# Patient Record
Sex: Female | Born: 1945 | Race: Black or African American | Hispanic: No | Marital: Single | Smoking: Former smoker
Health system: Southern US, Community
[De-identification: ages and names within clinical notes are randomized; demographics above are authoritative.]

## PROBLEM LIST (undated history)

## (undated) DIAGNOSIS — M1990 Unspecified osteoarthritis, unspecified site: Secondary | ICD-10-CM

## (undated) DIAGNOSIS — I10 Essential (primary) hypertension: Secondary | ICD-10-CM

## (undated) DIAGNOSIS — E739 Lactose intolerance, unspecified: Secondary | ICD-10-CM

## (undated) DIAGNOSIS — C801 Malignant (primary) neoplasm, unspecified: Secondary | ICD-10-CM

## (undated) DIAGNOSIS — B019 Varicella without complication: Secondary | ICD-10-CM

## (undated) DIAGNOSIS — E785 Hyperlipidemia, unspecified: Secondary | ICD-10-CM

## (undated) DIAGNOSIS — R739 Hyperglycemia, unspecified: Secondary | ICD-10-CM

## (undated) DIAGNOSIS — E669 Obesity, unspecified: Secondary | ICD-10-CM

## (undated) DIAGNOSIS — T7840XA Allergy, unspecified, initial encounter: Secondary | ICD-10-CM

## (undated) HISTORY — DX: Malignant (primary) neoplasm, unspecified: C80.1

## (undated) HISTORY — DX: Unspecified osteoarthritis, unspecified site: M19.90

## (undated) HISTORY — DX: Hyperglycemia, unspecified: R73.9

## (undated) HISTORY — DX: Hyperlipidemia, unspecified: E78.5

## (undated) HISTORY — PX: HAND SURGERY: SHX662

## (undated) HISTORY — DX: Obesity, unspecified: E66.9

## (undated) HISTORY — PX: KNEE SURGERY: SHX244

## (undated) HISTORY — DX: Allergy, unspecified, initial encounter: T78.40XA

## (undated) HISTORY — PX: MASTECTOMY: SHX3

## (undated) HISTORY — DX: Lactose intolerance, unspecified: E73.9

## (undated) HISTORY — DX: Varicella without complication: B01.9

## (undated) HISTORY — DX: Essential (primary) hypertension: I10

## (undated) HISTORY — PX: CYST EXCISION: SHX5701

---

## 2004-05-14 HISTORY — PX: COLONOSCOPY: SHX174

## 2005-02-01 ENCOUNTER — Encounter: Admission: RE | Admit: 2005-02-01 | Discharge: 2005-02-01 | Payer: Self-pay | Admitting: Obstetrics and Gynecology

## 2005-02-20 ENCOUNTER — Ambulatory Visit: Payer: Self-pay | Admitting: Family Medicine

## 2006-02-08 ENCOUNTER — Ambulatory Visit: Payer: Self-pay | Admitting: Family Medicine

## 2006-02-20 ENCOUNTER — Encounter: Admission: RE | Admit: 2006-02-20 | Discharge: 2006-02-20 | Payer: Self-pay | Admitting: Family Medicine

## 2006-02-26 ENCOUNTER — Ambulatory Visit: Payer: Self-pay | Admitting: Cardiology

## 2006-03-14 ENCOUNTER — Encounter: Payer: Self-pay | Admitting: Cardiology

## 2006-03-14 ENCOUNTER — Ambulatory Visit: Payer: Self-pay

## 2006-03-22 ENCOUNTER — Ambulatory Visit: Payer: Self-pay | Admitting: Family Medicine

## 2006-03-28 ENCOUNTER — Ambulatory Visit: Payer: Self-pay | Admitting: Cardiology

## 2006-05-19 ENCOUNTER — Encounter: Payer: Self-pay | Admitting: Family Medicine

## 2006-05-29 ENCOUNTER — Ambulatory Visit: Payer: Self-pay | Admitting: Family Medicine

## 2006-05-29 ENCOUNTER — Encounter: Payer: Self-pay | Admitting: Family Medicine

## 2006-05-29 ENCOUNTER — Other Ambulatory Visit: Admission: RE | Admit: 2006-05-29 | Discharge: 2006-05-29 | Payer: Self-pay | Admitting: Family Medicine

## 2006-05-29 LAB — CONVERTED CEMR LAB
Albumin: 4.2 g/dL (ref 3.5–5.2)
Alkaline Phosphatase: 50 units/L (ref 39–117)
BUN: 21 mg/dL (ref 6–23)
Basophils Relative: 1.9 % — ABNORMAL HIGH (ref 0.0–1.0)
CO2: 30 meq/L (ref 19–32)
Cholesterol: 173 mg/dL (ref 0–200)
HDL: 61.8 mg/dL (ref >39.0)
Hemoglobin: 11.4 g/dL — ABNORMAL LOW (ref 12.0–15.0)
Lymphocytes Relative: 47.9 % — ABNORMAL HIGH (ref 12.0–46.0)
MCHC: 32.8 g/dL (ref 30.0–36.0)
Monocytes Absolute: 0.2 10*3/uL (ref 0.2–0.7)
Monocytes Relative: 4.9 % (ref 3.0–11.0)
Neutro Abs: 1.7 10*3/uL (ref 1.4–7.7)
Phosphorus: 2.6 mg/dL (ref 2.3–4.6)
Potassium: 3.7 meq/L (ref 3.5–5.1)
TSH: 0.38 microintl units/mL (ref 0.35–5.50)
Total Bilirubin: 0.8 mg/dL (ref 0.3–1.2)

## 2006-06-13 ENCOUNTER — Ambulatory Visit: Payer: Self-pay | Admitting: Family Medicine

## 2006-06-13 LAB — CONVERTED CEMR LAB
Basophils Relative: 1.4 % — ABNORMAL HIGH (ref 0.0–1.0)
Eosinophils Relative: 2.5 % (ref 0.0–5.0)
Folate: 8 ng/mL
Iron: 75 ug/dL (ref 42–145)
Lymphocytes Relative: 44.3 % (ref 12.0–46.0)
Neutro Abs: 2 10*3/uL (ref 1.4–7.7)
Platelets: 308 10*3/uL (ref 150–400)
RBC: 3.59 M/uL — ABNORMAL LOW (ref 3.87–5.11)
RDW: 12.7 % (ref 11.5–14.6)
Saturation Ratios: 23.3 % (ref 20.0–50.0)
Total CK: 334 units/L (ref 7–177)
Transferrin: 230.3 mg/dL (ref >=212.0)
WBC: 4.5 10*3/uL (ref 4.5–10.5)

## 2006-11-26 ENCOUNTER — Encounter: Payer: Self-pay | Admitting: Family Medicine

## 2006-11-26 DIAGNOSIS — I10 Essential (primary) hypertension: Secondary | ICD-10-CM

## 2006-11-26 DIAGNOSIS — J309 Allergic rhinitis, unspecified: Secondary | ICD-10-CM | POA: Insufficient documentation

## 2006-11-26 DIAGNOSIS — Z853 Personal history of malignant neoplasm of breast: Secondary | ICD-10-CM | POA: Insufficient documentation

## 2006-11-26 DIAGNOSIS — H409 Unspecified glaucoma: Secondary | ICD-10-CM | POA: Insufficient documentation

## 2006-11-26 DIAGNOSIS — I428 Other cardiomyopathies: Secondary | ICD-10-CM

## 2006-11-27 ENCOUNTER — Ambulatory Visit: Payer: Self-pay | Admitting: Family Medicine

## 2006-11-29 ENCOUNTER — Encounter: Payer: Self-pay | Admitting: Family Medicine

## 2007-03-05 ENCOUNTER — Encounter: Admission: RE | Admit: 2007-03-05 | Discharge: 2007-03-05 | Payer: Self-pay | Admitting: Family Medicine

## 2007-03-10 ENCOUNTER — Encounter (INDEPENDENT_AMBULATORY_CARE_PROVIDER_SITE_OTHER): Payer: Self-pay | Admitting: *Deleted

## 2007-07-30 ENCOUNTER — Ambulatory Visit: Payer: Self-pay | Admitting: Family Medicine

## 2007-09-03 ENCOUNTER — Encounter: Payer: Self-pay | Admitting: Family Medicine

## 2007-09-03 ENCOUNTER — Ambulatory Visit: Payer: Self-pay | Admitting: Family Medicine

## 2007-09-03 ENCOUNTER — Other Ambulatory Visit: Admission: RE | Admit: 2007-09-03 | Discharge: 2007-09-03 | Payer: Self-pay | Admitting: Family Medicine

## 2007-09-03 DIAGNOSIS — E785 Hyperlipidemia, unspecified: Secondary | ICD-10-CM

## 2007-09-03 DIAGNOSIS — Z87891 Personal history of nicotine dependence: Secondary | ICD-10-CM

## 2007-09-04 LAB — CONVERTED CEMR LAB
ALT: 22 units/L (ref 0–35)
AST: 27 units/L (ref 0–37)
Basophils Relative: 0.3 % (ref 0.0–1.0)
Bilirubin, Direct: 0.1 mg/dL (ref 0.0–0.3)
CO2: 28 meq/L (ref 19–32)
Calcium: 9.7 mg/dL (ref 8.4–10.5)
Chloride: 104 meq/L (ref 96–112)
Creatinine, Ser: 1 mg/dL (ref 0.4–1.2)
Glucose, Bld: 93 mg/dL (ref 70–99)
Hemoglobin: 11.6 g/dL — ABNORMAL LOW (ref 12.0–15.0)
LDL Cholesterol: 94 mg/dL (ref 0–99)
Lymphocytes Relative: 37.7 % (ref 12.0–46.0)
Monocytes Relative: 4.8 % (ref 3.0–12.0)
Neutro Abs: 2.1 10*3/uL (ref 1.4–7.7)
Neutrophils Relative %: 55 % (ref 43.0–77.0)
RBC: 3.42 M/uL — ABNORMAL LOW (ref 3.87–5.11)
Sodium: 141 meq/L (ref 135–145)
Total Bilirubin: 0.6 mg/dL (ref 0.3–1.2)
Total CHOL/HDL Ratio: 2.4
Total Protein: 7.2 g/dL (ref 6.0–8.3)
VLDL: 12 mg/dL (ref 0–40)

## 2007-09-09 ENCOUNTER — Encounter (INDEPENDENT_AMBULATORY_CARE_PROVIDER_SITE_OTHER): Payer: Self-pay | Admitting: *Deleted

## 2007-09-09 LAB — CONVERTED CEMR LAB: Pap Smear: NORMAL

## 2007-09-22 ENCOUNTER — Ambulatory Visit: Payer: Self-pay | Admitting: Family Medicine

## 2007-09-22 LAB — CONVERTED CEMR LAB: OCCULT 3: NEGATIVE

## 2007-09-23 ENCOUNTER — Encounter: Admission: RE | Admit: 2007-09-23 | Discharge: 2007-09-23 | Payer: Self-pay | Admitting: Family Medicine

## 2007-09-23 ENCOUNTER — Encounter: Payer: Self-pay | Admitting: Family Medicine

## 2007-09-24 ENCOUNTER — Encounter (INDEPENDENT_AMBULATORY_CARE_PROVIDER_SITE_OTHER): Payer: Self-pay | Admitting: *Deleted

## 2007-09-25 ENCOUNTER — Encounter (INDEPENDENT_AMBULATORY_CARE_PROVIDER_SITE_OTHER): Payer: Self-pay | Admitting: *Deleted

## 2007-10-07 ENCOUNTER — Ambulatory Visit: Payer: Self-pay | Admitting: Family Medicine

## 2007-10-07 DIAGNOSIS — Z862 Personal history of diseases of the blood and blood-forming organs and certain disorders involving the immune mechanism: Secondary | ICD-10-CM | POA: Insufficient documentation

## 2007-10-10 LAB — CONVERTED CEMR LAB
Basophils Relative: 1.3 % — ABNORMAL HIGH (ref 0.0–1.0)
Eosinophils Absolute: 0.1 10*3/uL (ref 0.0–0.7)
Eosinophils Relative: 2.7 % (ref 0.0–5.0)
Folate: 10.7 ng/mL
HCT: 34.2 % — ABNORMAL LOW (ref 36.0–46.0)
Hemoglobin: 11.2 g/dL — ABNORMAL LOW (ref 12.0–15.0)
MCV: 102.5 fL — ABNORMAL HIGH (ref 78.0–100.0)
Monocytes Absolute: 0.2 10*3/uL (ref 0.1–1.0)
Monocytes Relative: 4.9 % (ref 3.0–12.0)
Neutro Abs: 2.1 10*3/uL (ref 1.4–7.7)
Platelets: 264 10*3/uL (ref 150–400)
WBC: 3.9 10*3/uL — ABNORMAL LOW (ref 4.5–10.5)

## 2007-12-03 ENCOUNTER — Ambulatory Visit: Payer: Self-pay | Admitting: Family Medicine

## 2007-12-04 LAB — CONVERTED CEMR LAB
Eosinophils Absolute: 0.3 10*3/uL (ref 0.0–0.7)
Ferritin: 197.7 ng/mL (ref 10.0–291.0)
HCT: 34 % — ABNORMAL LOW (ref 36.0–46.0)
Hemoglobin: 12 g/dL (ref 12.0–15.0)
Iron: 70 ug/dL (ref 42–145)
MCHC: 35.4 g/dL (ref 30.0–36.0)
MCV: 99.3 fL (ref 78.0–100.0)
Monocytes Absolute: 0.4 10*3/uL (ref 0.1–1.0)
Monocytes Relative: 8.8 % (ref 3.0–12.0)
Neutro Abs: 2.2 10*3/uL (ref 1.4–7.7)
Platelets: 295 10*3/uL (ref 150–400)
RDW: 12.8 % (ref 11.5–14.6)

## 2008-01-14 ENCOUNTER — Ambulatory Visit: Payer: Self-pay | Admitting: Family Medicine

## 2008-03-25 ENCOUNTER — Encounter: Admission: RE | Admit: 2008-03-25 | Discharge: 2008-03-25 | Payer: Self-pay | Admitting: Family Medicine

## 2008-03-30 ENCOUNTER — Encounter: Payer: Self-pay | Admitting: Family Medicine

## 2008-05-18 ENCOUNTER — Ambulatory Visit: Payer: Self-pay | Admitting: Family Medicine

## 2008-05-18 DIAGNOSIS — E669 Obesity, unspecified: Secondary | ICD-10-CM

## 2008-05-25 ENCOUNTER — Ambulatory Visit: Payer: Self-pay | Admitting: Family Medicine

## 2008-08-02 ENCOUNTER — Telehealth: Payer: Self-pay | Admitting: Family Medicine

## 2008-08-25 ENCOUNTER — Ambulatory Visit: Payer: Self-pay | Admitting: Family Medicine

## 2008-08-26 LAB — CONVERTED CEMR LAB
ALT: 28 units/L (ref 0–35)
AST: 37 units/L (ref 0–37)
Basophils Absolute: 0 10*3/uL (ref 0.0–0.1)
Basophils Relative: 1 % (ref 0.0–3.0)
CO2: 29 meq/L (ref 19–32)
Chloride: 104 meq/L (ref 96–112)
Eosinophils Absolute: 0.2 10*3/uL (ref 0.0–0.7)
HDL: 70.5 mg/dL (ref >39.00)
Hemoglobin: 12.2 g/dL (ref 12.0–15.0)
Lymphocytes Relative: 45.3 % (ref 12.0–46.0)
MCHC: 33.4 g/dL (ref 30.0–36.0)
Monocytes Relative: 8.9 % (ref 3.0–12.0)
Neutrophils Relative %: 39.7 % — ABNORMAL LOW (ref 43.0–77.0)
Phosphorus: 3.6 mg/dL (ref 2.3–4.6)
Potassium: 3.7 meq/L (ref 3.5–5.1)
RBC: 3.56 M/uL — ABNORMAL LOW (ref 3.87–5.11)
RDW: 13 % (ref 11.5–14.6)
TSH: 0.44 microintl units/mL (ref 0.35–5.50)
Total CHOL/HDL Ratio: 3

## 2008-08-30 ENCOUNTER — Ambulatory Visit: Payer: Self-pay | Admitting: Family Medicine

## 2008-08-30 DIAGNOSIS — R7309 Other abnormal glucose: Secondary | ICD-10-CM | POA: Insufficient documentation

## 2008-12-15 ENCOUNTER — Ambulatory Visit: Payer: Self-pay | Admitting: Family Medicine

## 2009-01-03 ENCOUNTER — Encounter: Admission: RE | Admit: 2009-01-03 | Discharge: 2009-01-03 | Payer: Self-pay | Admitting: Family Medicine

## 2009-01-05 ENCOUNTER — Encounter: Payer: Self-pay | Admitting: Family Medicine

## 2009-01-27 ENCOUNTER — Encounter: Payer: Self-pay | Admitting: Family Medicine

## 2009-02-03 ENCOUNTER — Ambulatory Visit: Payer: Self-pay | Admitting: Family Medicine

## 2009-02-21 ENCOUNTER — Encounter: Payer: Self-pay | Admitting: Family Medicine

## 2009-02-24 ENCOUNTER — Encounter: Admission: RE | Admit: 2009-02-24 | Discharge: 2009-02-24 | Payer: Self-pay | Admitting: Orthopedic Surgery

## 2009-03-03 ENCOUNTER — Encounter: Payer: Self-pay | Admitting: Family Medicine

## 2009-03-18 ENCOUNTER — Ambulatory Visit (HOSPITAL_BASED_OUTPATIENT_CLINIC_OR_DEPARTMENT_OTHER): Admission: RE | Admit: 2009-03-18 | Discharge: 2009-03-18 | Payer: Self-pay | Admitting: Orthopedic Surgery

## 2009-03-24 ENCOUNTER — Encounter: Payer: Self-pay | Admitting: Family Medicine

## 2009-03-28 ENCOUNTER — Encounter: Admission: RE | Admit: 2009-03-28 | Discharge: 2009-03-28 | Payer: Self-pay | Admitting: Family Medicine

## 2009-03-30 ENCOUNTER — Encounter (INDEPENDENT_AMBULATORY_CARE_PROVIDER_SITE_OTHER): Payer: Self-pay | Admitting: *Deleted

## 2009-03-31 ENCOUNTER — Encounter: Payer: Self-pay | Admitting: Family Medicine

## 2009-04-11 ENCOUNTER — Encounter: Payer: Self-pay | Admitting: Family Medicine

## 2009-04-12 ENCOUNTER — Ambulatory Visit: Payer: Self-pay | Admitting: Family Medicine

## 2009-05-16 ENCOUNTER — Encounter: Payer: Self-pay | Admitting: Family Medicine

## 2009-06-13 ENCOUNTER — Encounter: Payer: Self-pay | Admitting: Family Medicine

## 2009-06-16 ENCOUNTER — Ambulatory Visit: Payer: Self-pay | Admitting: Family Medicine

## 2009-06-16 LAB — CONVERTED CEMR LAB
CO2: 30 meq/L (ref 19–32)
Calcium: 9.3 mg/dL (ref 8.4–10.5)
Chloride: 105 meq/L (ref 96–112)
Cholesterol: 163 mg/dL (ref 0–200)
HDL: 66.1 mg/dL (ref >39.00)
Potassium: 3.9 meq/L (ref 3.5–5.1)
Sodium: 141 meq/L (ref 135–145)
Triglycerides: 96 mg/dL (ref 0.0–149.0)

## 2009-06-22 ENCOUNTER — Ambulatory Visit: Payer: Self-pay | Admitting: Family Medicine

## 2009-06-30 ENCOUNTER — Ambulatory Visit: Payer: Self-pay | Admitting: Family Medicine

## 2009-06-30 DIAGNOSIS — M76829 Posterior tibial tendinitis, unspecified leg: Secondary | ICD-10-CM

## 2009-06-30 DIAGNOSIS — S43109A Unspecified dislocation of unspecified acromioclavicular joint, initial encounter: Secondary | ICD-10-CM | POA: Insufficient documentation

## 2009-07-02 ENCOUNTER — Encounter: Admission: RE | Admit: 2009-07-02 | Discharge: 2009-07-02 | Payer: Self-pay | Admitting: Family Medicine

## 2009-07-04 ENCOUNTER — Telehealth: Payer: Self-pay | Admitting: Family Medicine

## 2009-07-06 ENCOUNTER — Encounter: Payer: Self-pay | Admitting: Family Medicine

## 2009-07-06 ENCOUNTER — Encounter: Admission: RE | Admit: 2009-07-06 | Discharge: 2009-07-06 | Payer: Self-pay | Admitting: Family Medicine

## 2009-07-07 ENCOUNTER — Telehealth: Payer: Self-pay | Admitting: Family Medicine

## 2009-08-25 ENCOUNTER — Ambulatory Visit: Payer: Self-pay | Admitting: Family Medicine

## 2009-10-24 ENCOUNTER — Encounter: Payer: Self-pay | Admitting: Family Medicine

## 2009-12-16 ENCOUNTER — Ambulatory Visit: Payer: Self-pay | Admitting: Family Medicine

## 2009-12-17 LAB — CONVERTED CEMR LAB
Albumin: 4.1 g/dL (ref 3.5–5.2)
BUN: 20 mg/dL (ref 6–23)
Chloride: 105 meq/L (ref 96–112)
Cholesterol: 191 mg/dL (ref 0–200)
Creatinine, Ser: 0.9 mg/dL (ref 0.4–1.2)
Glucose, Bld: 98 mg/dL (ref 70–99)
HDL: 64.2 mg/dL (ref >39.00)
LDL Cholesterol: 110 mg/dL — ABNORMAL HIGH (ref 0–99)
Phosphorus: 3.3 mg/dL (ref 2.3–4.6)
TSH: 0.6 microintl units/mL (ref 0.35–5.50)
Triglycerides: 85 mg/dL (ref 0.0–149.0)
VLDL: 17 mg/dL (ref 0.0–40.0)

## 2009-12-21 ENCOUNTER — Ambulatory Visit: Payer: Self-pay | Admitting: Family Medicine

## 2010-02-15 ENCOUNTER — Ambulatory Visit: Payer: Self-pay | Admitting: Family Medicine

## 2010-02-15 ENCOUNTER — Telehealth: Payer: Self-pay | Admitting: Family Medicine

## 2010-02-16 ENCOUNTER — Encounter: Payer: Self-pay | Admitting: Family Medicine

## 2010-02-21 ENCOUNTER — Ambulatory Visit: Payer: Self-pay | Admitting: Family Medicine

## 2010-03-29 ENCOUNTER — Encounter: Admission: RE | Admit: 2010-03-29 | Discharge: 2010-03-29 | Payer: Self-pay | Admitting: Family Medicine

## 2010-03-29 LAB — HM MAMMOGRAPHY: HM Mammogram: NORMAL

## 2010-03-30 ENCOUNTER — Encounter (INDEPENDENT_AMBULATORY_CARE_PROVIDER_SITE_OTHER): Payer: Self-pay | Admitting: *Deleted

## 2010-03-30 ENCOUNTER — Encounter: Payer: Self-pay | Admitting: Family Medicine

## 2010-04-14 ENCOUNTER — Ambulatory Visit: Payer: Self-pay | Admitting: Family Medicine

## 2010-04-14 DIAGNOSIS — M549 Dorsalgia, unspecified: Secondary | ICD-10-CM | POA: Insufficient documentation

## 2010-04-14 DIAGNOSIS — R109 Unspecified abdominal pain: Secondary | ICD-10-CM

## 2010-04-14 LAB — CONVERTED CEMR LAB
Blood in Urine, dipstick: NEGATIVE
Ketones, urine, test strip: NEGATIVE
Nitrite: NEGATIVE
Specific Gravity, Urine: 1.025
pH: 5

## 2010-06-04 ENCOUNTER — Encounter: Payer: Self-pay | Admitting: Family Medicine

## 2010-06-13 NOTE — Assessment & Plan Note (Signed)
Summary: ANKLE LUMP PER DR TOWER/[facility]   Vital Signs:  Patient profile:   65 year old female Height:      66 inches Weight:      227 pounds BMI:     36.77 Temp:     98.1 degrees F oral Pulse rate:   80 / minute Pulse rhythm:   regular BP sitting:   120 / 70  (left arm) Cuff size:   large  Vitals Entered By: Benny Lennert CMA Duncan Dull) (June 30, 2009 11:07 AM)  History of Present Illness: Chief complaint lump on right ankle  65 year old seen at the request of Dr. Milinda Antis for chronic R sided medial ankle pain ongoing now since early spring or summer last year. Additionally, she has some R sided clicking, popping, and discomfort of the R shoulder.   1. Ankle pain / PT: patient has pain at the medial ankle, and fullness and puffiness as well as a palpable area  just medial and posterior to the malleolus..  She denies any specific injury or trauma. Does have a dull throbbing pain occasionally, however she is able to walk.  He has a prior traumatic fracture operative surgery to the affected foot or ankle.  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  She continues to be symptomatic, and is here for additional insight. additionally, she has some occasional tingling sensations  distally on the  dorsum of her foot and medially.  3.R sided shoulder: her sister has cancer, and she has been actively involved in her care and doing movement and transfers for her.  She may have injured her right shoulder within the last week, now she feels some clicking and popping in her shoulder.  She's not had any focal pain, swelling or bruising. She is not having any significant pain with abduction or reaching across her body.  No prior traumatic fractures or injuries that she can recall in the affected shoulder.     Allergies: 1)  ! Claritin 2)  ! Zocor  Past History:  Past medical,  surgical, family and social histories (including risk factors) reviewed, and no changes noted (except as noted below).  Past Medical History: Reviewed history from 03/20/2009 and no changes required. Allergic rhinitis Breast cancer, hx of Hypertension hyperlipidemia  obesity cataract- mild  lactose intolerant  hyperglycemia   hand surg- Dr Merlyn Lot  Past Surgical History: Reviewed history from 03/20/2009 and no changes required. colonoscopy- normal (11/2003) ? abn cardiac test Pelvic US- neg (02/2006) Nuclear stress- mild global hypokinesis, left  vent enlargement 911/2007) CT abd/ pelvis- kidney and hepatic cysts, some fibrosis at lung base (03/2006) Carotid US- non obstructive plaque, EF 48% (2007) tendon surgery in hand   Family History: Reviewed history from 06/22/2009 and no changes required. Father: deceased CHF, HTN Mother:  Siblings: brother deceased from massive MI uncle with lung cancer  sister with pancreatic cancer   Social History: Reviewed history from 09/03/2007 and no changes required. 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 Otherwise, the pertinent positives and negatives are listed above  and in the HPI, otherwise a full review of systems has been reviewed and is negative unless noted positive.   Physical Exam  General:  Well-developed,well-nourished,in no acute distress; alert,appropriate and cooperative throughout examination Head:  Normocephalic and atraumatic without obvious abnormalities. No apparent alopecia or balding. Eyes:  vision grossly intact.   Ears:  no external deformities.   Nose:  no external deformity.     Mouth:  Oral mucosa and oropharynx without lesions or exudates.  Teeth in good repair. Neck:  No deformities, masses, or tenderness noted. Chest Wall:  No deformities, masses, or tenderness noted. Lungs:  normal respiratory effort.   Msk:  RIGHT FOOT Echymosis: no Edema: noticable edema on the PT tendon on the R with a dramatic fullness palpable proximally on the tract of the tendon ROM: full LE B Gait: heel toe, minimally antalgic with dramatic pronation MT pain: no Lateral Mall: NT Medial Mall: NT Talus: NT Navicular: NT Cuboid: NT Calcaneous: NT Metatarsals: NT 5th MT: NT Phalanges: NT Achilles: NT Plantar Fascia: NT Fat Pad: NT Peroneals: NT Post Tib: as above -- TTP Great Toe: Nml motion Ant Drawer: neg ATFL: NT CFL: NT Deltoid: NT Long arch: marked PES PLANUS Transverse arch: dramatic foot breakdown with bunion, bunionette formation and hammertoes Hindfoot breakdown: minimal Sensation: intact   Left FOOT Echymosis: no Edema:none ROM: full LE B Gait: heel toe, pronation MT pain: no Lateral Mall: NT Medial Mall: NT Talus: NT Navicular: NT Cuboid: NT Calcaneous: NT Metatarsals: NT 5th MT: NT Phalanges: NT Achilles: NT Plantar Fascia: NT Fat Pad: NT Peroneals: NT Post Tib: NT Great Toe: Nml motion Ant Drawer: neg ATFL: NT CFL: NT Deltoid: NT Long arch: marked PES PLANUS Transverse arch: dramatic foot breakdown with bunion, bunionette formation and hammertoes Hindfoot breakdown: minimal Sensation: intact   Extremities:  no clubbing, cyanosis, or edema Neurologic:  alert & oriented X3 and sensation intact to light touch.   Skin:  Intact without suspicious lesions or rashes Psych:  Cognition and judgment appear intact. Alert and cooperative with normal attention span and concentration. No apparent delusions, illusions, hallucinations Additional Exam:  right shoulder: Full range of motion in all directions, abduction,  internal and external  rotation,  flexion. Strength testing is 5/5 in all directions. There is some mild pain with terminal internal range of motion and terminal external range of motion.  With abduction there is some clicking and popping and some mild instability felt that the acromioclavicular joint.  There's also some mild tenderness to palpation at the acromioclavicular joint.  Nontender at the bicipital groove. Minimally tender the supraspinatus insertion. Negative Jobe test. Negative Leanord Asal test. Negative Neer test.  Left shoulder is grossly normal examination.   Impression & Recommendations:  Problem # 1:  ANKLE PAIN (ICD-719.47) X-ray, Ankle: AP, Lateral, and Mortise Views Indication: Ankle pain Findings: There is no evidence for acute fracture or dislocation. The mortise appears preserved.   the palpable area on the medial aspect  along the posterior tibialis tender  is interesting.  Does appear to be either a solid mass structure or a cystic structure  that encompasses and is around the entire  tendon.. Given the patient's paresthesias and distal pain  that sounds to be neuropathic,  and some reproducible pain that I was able to produce with percussion of  this area,  I am suspicious that it fully encompasses  nerve tissue.  Obtain MRI of the ankle to evaluate the structure.  Certainly the patient has  tenosynovitis and posterior tibial tendinitis, evaluate for  longitudinal care, and primarily evaluate this cystic versus  mass structure in the medial ankle.  MRI will dictate next steps  >45 minutes spent in total face to face time with the patient with >50% of time spent in counselling and coordination of care  cc: Dr. Milinda Antis  Orders: Radiology other (Radiology Other) Radiology Referral (Radiology)  Problem # 2:  TIBIALIS TENDINITIS (ICD-726.72) reviewed with her some basic posterior tibialis rehabilitation, and anatomy, and additionally in going to have her use some over-the-counter  orthotics.   She was placed and an ASO ankle brace for  intermittent use with increased activity.  Problem # 3:  ACROMIOCLAVICULAR JOINT SEPARATION, RIGHT (ICD-831.04) on examination palpation there is some pain at the acromio clavicular joint, patient likely has some a.c. joint arthropathy with some inflammation, and may have torn some a.c. joint  ligaments, now with some increased right motion at the ac joint with some popping but stable  activity mod for the next 7-10 days  Problem # 4:  SHOULDER PAIN, RIGHT (ICD-719.41) XR shoulder, AP, supraspinatus outlet view. GH Indication: pain there is definite  evidence of acromioclavicular joint osteoarthritic changes, there is some elevation of the acromion  relatively distal clavicle and on certain views. There does not appear to be a 100% displacement. Patient has a type II acromion. No evidence of occult fracture. glenohumeral arthritic changes are seen.  Her updated medication list for this problem includes:    Adult Aspirin Low Strength 81 Mg Tbdp (Aspirin) .Marland Kitchen... Take one by mouth daily  Orders: Radiology other (Radiology Other)  Complete Medication List: 1)  Benazepril-hydrochlorothiazide 20-12.5 Mg Tabs (Benazepril-hydrochlorothiazide) .... Take 2 by mouth daily 2)  Travatan 0.004 % Soln (Travoprost) .... One gtt q hs 3)  Adult Aspirin Low Strength 81 Mg Tbdp (Aspirin) .... Take one by mouth daily 4)  Amlodipine Besylate 5 Mg Tabs (Amlodipine besylate) .... Take one by mouth daily 5)  Flonase 50 Mcg/act Susp (Fluticasone propionate) .... 2 sprays in each nostril daily as needed 6)  Omega 3  .... Take one by mouth daily 7)  Multivitamins Tabs (Multiple vitamin) .... One by mouth daily 8)  Vitamin D 1000 Unit Tabs (Cholecalciferol) .... Daily 9)  Coenzyme Q10 100 Mg Caps (Coenzyme q10) .... One daily 10)  Vitamin B-12 1000 Mcg Tabs (Cyanocobalamin) .... Take one daily  Patient Instructions: 1)  Excellent Over the Counter  Orthotics: 2)  ******Hapad: available at www.hapad.com 3)  ******SPENCO: Available at some sports stores or www.amazon.com 4)  www.pedifix.com and Financial controller [employer]" website: multiple good foot products 5)  SHOES: Danskos, Merrells, Keens, Clarks - good arch support, want minimal bendability 6)  [employer] [facility] [facility] in [place]: excellent staff, shoe selection, OTC orthotics 7)  Shoes: Birkenstock shoes, [employer] 8)  ***THE [facility], [address] [place], [place]***   Current Allergies (reviewed today): ! CLARITIN ! ZOCOR

## 2010-06-13 NOTE — Letter (Signed)
Summary: [facility] [place]   Imported By: Lanelle Bal 06/20/2009 11:58:07  _____________________________________________________________________  External Attachment:    Type:   Image     Comment:   External Document

## 2010-06-13 NOTE — Letter (Signed)
Summary: [facility]   Imported By: Lanelle Bal 05/30/2009 07:52:55  _____________________________________________________________________  External Attachment:    Type:   Image     Comment:   External Document

## 2010-06-13 NOTE — Assessment & Plan Note (Signed)
Summary: F/U ON LABS 6 [facility] CYD   Vital Signs:  Patient profile:   65 year old female Height:      66 inches Weight:      227 pounds BMI:     36.77 Temp:     98.4 degrees F oral Pulse rate:   88 / minute Pulse rhythm:   regular BP sitting:   130 / 80  (left arm) Cuff size:   large  Vitals Entered By: Lewanda Rife LPN 2009/06/28 12:19 PM)  History of Present Illness: here for f/u of HTN and lipids and hyperglycemia   has been feeling good overall - no new complaints   found out that her sister has pancreatic ca - and taking care of her  unsure what her prognosis is   wt is stable  she is getting back to the gym    bp good at 130/80- has been well controlled with no problems   lipids better this time with trig 96/ HDL 66 and LDL 78 (down from 103) diet - is really healthy -- and feels better doing that    sugar better too 107 fasting   swollen area on medial L ankle- starting to bother her  bigger on some days  Allergies: 1)  ! Claritin 2)  ! Zocor  Past History:  Past Medical History: Last updated: 03/20/2009 Allergic rhinitis Breast cancer, hx of Hypertension hyperlipidemia  obesity cataract- mild  lactose intolerant  hyperglycemia   hand surg- Dr Merlyn Lot  Past Surgical History: Last updated: 03/20/2009 colonoscopy- normal (11/2003) ? abn cardiac test Pelvic US- neg (02/2006) Nuclear stress- mild global hypokinesis, left  vent enlargement 911/2007) CT abd/ pelvis- kidney and hepatic cysts, some fibrosis at lung base (03/2006) Carotid US- non obstructive plaque, EF 48% (2007) tendon surgery in hand   Family History: Last updated: 28-Jun-2009 Father: deceased CHF, HTN Mother:  Siblings: brother deceased from massive MI uncle with lung cancer  sister with pancreatic cancer   Social History: Last updated: 09/03/2007 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  former smoker  2006  Risk Factors: Smoking Status: quit (11/26/2006)  Family History: Father: deceased CHF, HTN Mother:  Siblings: brother deceased from massive MI uncle with lung cancer  sister with pancreatic cancer   Review of Systems General:  Denies fatigue, fever, loss of appetite, and malaise. Eyes:  Denies blurring and eye pain. CV:  Denies chest pain or discomfort, palpitations, shortness of breath with exertion, and swelling of feet. Resp:  Denies cough and wheezing. GI:  Denies abdominal pain, bloody stools, change in bowel habits, and nausea. GU:  Denies urinary frequency. MS:  Complains of joint swelling; denies joint pain and joint redness. Derm:  Denies lesion(s), poor wound healing, and rash. Neuro:  Denies numbness and tingling. Psych:  stressed in general . Endo:  Denies cold intolerance, excessive thirst, excessive urination, and heat intolerance. Heme:  Denies abnormal bruising and bleeding.  Physical Exam  General:  overweight but generally well appearing  Head:  normocephalic, atraumatic, and no abnormalities observed.   Eyes:  vision grossly intact, pupils equal, pupils round, and pupils reactive to light.  no conjunctival pallor, injection or icterus  Mouth:  pharynx pink and moist.   Neck:  No deformities, masses, or tenderness noted. Lungs:  Normal respiratory effort, chest expands symmetrically. Lungs are clear to auscultation, no crackles or wheezes. Heart:  Normal rate and regular rhythm. S1 and S2 normal  without gallop, murmur, click, rub or other extra sounds. Abdomen:  Bowel sounds positive,abdomen soft and non-tender without masses, organomegaly or hernias , no renal bruits  Msk:  No deformity or scoliosis noted of thoracic or lumbar spine.   medial L ankle - soft/ rubbery area of induration- is mobile Pulses:  R and L carotid,radial,femoral,dorsalis pedis and posterior tibial pulses are full and equal bilaterally Extremities:  No clubbing, cyanosis, edema, or  deformity noted with normal full range of motion of all joints.   Neurologic:  sensation intact to light touch, gait normal, and DTRs symmetrical and normal.   Skin:  Intact without suspicious lesions or rashes Cervical Nodes:  No lymphadenopathy noted Psych:  normal affect, talkative and pleasant    Impression & Recommendations:  Problem # 1:  FASTING HYPERGLYCEMIA (ICD-790.29) Assessment Improved  overall doing better with diet and exercise enc wt loss lab and f/u  70mo   Labs Reviewed: Creat: 1.1 (06/16/2009)     Problem # 2:  HYPERLIPIDEMIA (ICD-272.4) Assessment: Improved  improved further  rev low sat fat diet commended on this  lab and f/u 6 mo   Labs Reviewed: SGOT: 25 (06/16/2009)   SGPT: 21 (06/16/2009)   HDL:66.10 (06/16/2009), 70.50 (08/25/2008)  LDL:78 (06/16/2009), 103 (13/12/6576)  Chol:163 (06/16/2009), 187 (08/25/2008)  Trig:96.0 (06/16/2009), 67.0 (08/25/2008)  Problem # 3:  HYPERTENSION (ICD-401.9) Assessment: Unchanged  good control without change disc healthy diet (low simple sugar/ choose complex carbs/ low sat fat) diet and exercise in detail  lab and f/u 6 mo  no change in med  Her updated medication list for this problem includes:    Benazepril-hydrochlorothiazide 20-12.5 Mg Tabs (Benazepril-hydrochlorothiazide) .Marland Kitchen... Take 2 by mouth daily    Amlodipine Besylate 5 Mg Tabs (Amlodipine besylate) .Marland Kitchen... Take one by mouth daily  BP today: 130/80 Prior BP: 130/82 (04/12/2009)  Labs Reviewed: K+: 3.9 (06/16/2009) Creat: : 1.1 (06/16/2009)   Chol: 163 (06/16/2009)   HDL: 66.10 (06/16/2009)   LDL: 78 (06/16/2009)   TG: 96.0 (06/16/2009)  Orders: Prescription Created Electronically [phone])  Problem # 4:  ANKLE PAIN (XBM-841.32) Assessment: New with swelling/ growth that is chronic and worsening ref to Dr Patsy Lager for further eval  Complete Medication List: 1)  Benazepril-hydrochlorothiazide 20-12.5 Mg Tabs (Benazepril-hydrochlorothiazide) ....  Take 2 by mouth daily 2)  Travatan 0.004 % Soln (Travoprost) .... One gtt q hs 3)  Adult Aspirin Low Strength 81 Mg Tbdp (Aspirin) .... Take one by mouth daily 4)  Amlodipine Besylate 5 Mg Tabs (Amlodipine besylate) .... Take one by mouth daily 5)  Flonase 50 Mcg/act Susp (Fluticasone propionate) .... 2 sprays in each nostril daily as needed 6)  Omega 3  .... Take one by mouth daily 7)  Multivitamins Tabs (Multiple vitamin) .... One by mouth daily 8)  Vitamin D 1000 Unit Tabs (Cholecalciferol) .... Daily 9)  Coenzyme Q10 100 Mg Caps (Coenzyme q10) .... One daily 10)  Vitamin B-12 1000 Mcg Tabs (Cyanocobalamin) .... Take one daily  Other Orders: Pneumococcal Vaccine (44010) Admin 1st Vaccine (27253) Admin 1st Vaccine Horticulturist, commercial) [phone])  Patient Instructions: 1)  no change in medicines 2)  keep working on healthy diet and exercise for weight loss  3)  think about weight watchers program  4)  pneumonia vaccine today  5)  make appt with Dr Patsy Lager for ankle lump at check out  6)  schedule fasting labs in 6 months lipid/ast/alt/renal / tsh 272, 401.1 and then folow up  Prescriptions: AMLODIPINE BESYLATE 5 MG  TABS (AMLODIPINE BESYLATE) take one by mouth daily  #90 x 3   Entered and Authorized by:   Judith Part MD   Signed by:   Judith Part MD on 06/22/2009   Method used:   Electronically to        [id number] (retail)       [address]       [place], [place]  [address]       Ph: [phone]       Fax: [phone]   RxID:   [id number] BENAZEPRIL-HYDROCHLOROTHIAZIDE 20-12.5 MG  TABS (BENAZEPRIL-HYDROCHLOROTHIAZIDE) take 2 by mouth daily  #180 x 3   Entered and Authorized by:   Judith Part MD   Signed by:   Judith Part MD on 06/22/2009   Method used:   Electronically to        [id number] (retail)       [address]       [place], [place]  [address]       Ph: [phone]       Fax: [phone]   RxID:    [id number]   Current Allergies (reviewed today): ! CLARITIN ! ZOCOR   Pneumovax Vaccine    Vaccine Type: Pneumovax    Site: left deltoid    Mfr: Merck    Dose: 0.5 ml    Route: IM    Given by: Lewanda Rife LPN    Exp. Date: 09/01/2010    Lot #: [id number]    VIS given: 12/10/95 version given June 22, 2009.

## 2010-06-13 NOTE — Progress Notes (Signed)
Summary: MRI with contrast only.  Phone Note From Other Clinic   Caller: Receptionist Call For: Dr. Patsy Lager Summary of Call: [facility] called saying that they need another order to do an MRI of the right ankle with contrast only.  (See report in EMR).  Please do order for [place] to fax. Initial call taken by: Delilah Shan CMA Duncan Dull),  July 04, 2009 8:47 AM  Follow-up for Phone Call        noted and reviewed MRI Follow-up by: Hannah Beat MD,  July 04, 2009 8:51 AM

## 2010-06-13 NOTE — Assessment & Plan Note (Signed)
Summary: ABD PAIN,RIGHT SHOULDER,HIP PAIN/CLE   Vital Signs:  Patient profile:   65 year old female Weight:      224.25 pounds Temp:     97.8 degrees F oral Pulse rate:   70 / minute Pulse rhythm:   regular BP sitting:   134 / 72  (left arm) Cuff size:   large  Vitals Entered By: Selena Batten Dance CMA Duncan Dull) (April 14, 2010 8:43 AM) CC: Abd pain/Right shoulder pain/Right hip pain   History of Present Illness: CC: R sided pain  3 wk h/o R sided pain.  Started in hip and then progressed to include shoulder and lower abdomen.  No fevers/chills, nauesa/vomiting, d/c, BM changes, blood in stool.  No dysuria, no urgency or frequency.  Sometimes difficulty laying on R side and back.  h/o lactose intolerance.  No more bloating than usual.  no new foods.  + more flatulence.  Notes chocolate and spinach make her go to bathroom  R hip pain - worse with walking up stairs.  pain on outside of leg.  No radiation anywhere.  No numbness, no shooting pain down legs.  R shoulder pain - anterior superior shoulder.  Worse at night and in ams when awakens.  Tried alleve for this which didn't help.  lower abd pain - this week lower abd pain as well as R flank pain.  Pain characterized as achey throbbing pain.  Constant, but intensity varies.  Some urgency to go to bathroom.  No constipation.  Current Medications (verified): 1)  Benazepril-Hydrochlorothiazide 20-12.5 Mg  Tabs (Benazepril-Hydrochlorothiazide) .... Take 2 By Mouth Daily 2)  Travatan 0.004 %  Soln (Travoprost) .... One Gtt Q Hs 3)  Adult Aspirin Low Strength 81 Mg  Tbdp (Aspirin) .... Take One By Mouth Daily 4)  Amlodipine Besylate 5 Mg  Tabs (Amlodipine Besylate) .... Take One By Mouth Daily 5)  Omega 3 .... Take One By Mouth Daily 6)  Vitamin D 1000 Unit Tabs (Cholecalciferol) .... Daily 7)  Coenzyme Q10 100 Mg Caps (Coenzyme Q10) .... One Daily 8)  Vitamin B-12 1000 Mcg Tabs (Cyanocobalamin) .... 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   Eyes:  PERRLA, EOMI, no scleral injection Mouth:  pharynx pink and moist.   Lungs:  normal respiratory effort.  CTAB, no c/w Heart:  Normal rate and regular rhythm. S1 and S2 normal without gallop, murmur, click, rub or other extra sounds. Abdomen:  Bowel sounds positive,abdomen soft and non-tender without masses, organomegaly or hernias noted.  no guarding/rebound, no murphy sign.  + tender to palpation R inguinal region, no evidence of hernia.  no pain over [place] point Msk:  No deformity or scoliosis noted of thoracic or lumbar spine.  no midline tenderness, no paraspinous mm tenderness.  ++ tender over R SI joint.  + tender inferior to R GTB.  negative FABER.  no pain with int/ext rotation at hip. Pulses:  2+ rad pulses Extremities:  no pedal  edema Neurologic:  sensation intact, strength intact. Skin:  Intact without suspicious lesions or rashes   Impression & Recommendations:  Problem # 1:  BACK PAIN, RIGHT (ICD-724.5) maximal tenderness over R SI joint, ?sacroiliitis treat as such, handout provided.  NSAIDs and flexeril for next 1 wk, rtc in 1-2 wks if not better, sooner if worsening.  If not better, and continued pain there, consider referral for SI joint injection.  Also possible IT band irritation.    Her updated medication list for this problem includes:    Adult Aspirin Low Strength 81 Mg Tbdp (Aspirin)  .Marland Kitchen... Take one by mouth daily    Naprosyn 500 Mg Tabs (Naproxen) ..... One twice daily with food for 7 days then as needed pain    Flexeril 5 Mg Tabs (Cyclobenzaprine hcl) ..... One by mouth two times a day as needed muscle spasm  Problem # 2:  ABDOMINAL PAIN, LOWER (ICD-789.09)  unclear etiology.  checked UA today - normal. ? referred pain from SI joint inflammation.  If continued, will need to return for pelvic exam to eval ovaries.  Her updated medication list for this problem includes:    Adult Aspirin Low Strength 81 Mg Tbdp (Aspirin) .Marland Kitchen... Take one by mouth daily    Naprosyn 500 Mg Tabs (Naproxen) ..... One twice daily with food for 7 days then as needed pain    Flexeril 5 Mg Tabs (Cyclobenzaprine hcl) ..... One by mouth two times a day as needed muscle spasm  Orders: UA Dipstick w/o Micro (manual) (30865)  Complete Medication List: 1)  Benazepril-hydrochlorothiazide 20-12.5 Mg Tabs (Benazepril-hydrochlorothiazide) .... Take 2 by mouth daily 2)  Travatan 0.004 % Soln (Travoprost) .... One gtt q hs 3)  Adult Aspirin Low Strength 81 Mg Tbdp (Aspirin) .... Take one by mouth daily 4)  Amlodipine Besylate 5 Mg Tabs (Amlodipine besylate) .... Take one by mouth daily 5)  Omega 3  .... Take one by mouth daily 6)  Vitamin D 1000 Unit Tabs (Cholecalciferol) .... Daily 7)  Coenzyme Q10 100 Mg Caps (Coenzyme q10) .... One daily 8)  Vitamin B-12 1000 Mcg Tabs (Cyanocobalamin) .... Take one daily 9)  Naprosyn 500 Mg Tabs (Naproxen) .... One twice daily with food for 7 days then as needed pain 10)  Flexeril 5 Mg Tabs (Cyclobenzaprine hcl) .... One by mouth two times a day as needed muscle spasm  Patient Instructions: 1)  We will treat as sacroiliitis with course of naprosyn twice daily with food for 7 days and flexeril for muscle relaxant. 2)  You can ice or apply heating pad to SI joint, whichever soothes better. 3)  SI joint handout provided. 4)  If not better in 1-2 wks after this  treatment please return for further evaluation, possible pelvic exam to check ovaries.  If worsening despite these measures, please return sooner. 5)  Good to see you today, call clinic with questions. Prescriptions: FLEXERIL 5 MG TABS (CYCLOBENZAPRINE HCL) one by mouth two times a day as needed muscle spasm  #20 x 0   Entered and Authorized by:   Eustaquio Boyden  MD   Signed by:   Eustaquio Boyden  MD on 04/14/2010   Method used:   Electronically to        [id number] (retail)       [address]       [place], [place]  [address]       Ph: [phone]       Fax:  [phone]   RxID:   [id number] NAPROSYN 500 MG TABS (NAPROXEN) one twice daily with food for 7 days then as needed pain  #30 x 0   Entered and Authorized by:   Eustaquio Boyden  MD   Signed by:   Eustaquio Boyden  MD on 04/14/2010   Method used:   Electronically to        [id number] (retail)       [address]       [place], [place]  [address]       Ph: [phone]       Fax: [phone]   RxID:   [id number]    Orders Added: 1)  Est. Patient Level III [27062] 2)  UA Dipstick w/o Micro (manual) [81002]    Current Allergies (reviewed today): No known allergies   Laboratory Results   Urine Tests  Date/Time Received: April 14, 2010  Date/Time Reported: April 14, 2010 9:25 AM   Routine Urinalysis   Color: lt. yellow Appearance: Clear Glucose: negative   (Normal Range: Negative) Bilirubin: negative   (Normal Range: Negative) Ketone: negative   (Normal Range: Negative) Spec. Gravity: 1.025   (Normal Range: 1.003-1.035) Blood: negative   (Normal Range: Negative) pH: 5.0   (Normal Range: 5.0-8.0) Protein: negative   (Normal Range: Negative) Urobilinogen: 0.2   (Normal Range: 0-1) Nitrite: negative   (Normal Range: Negative) Leukocyte Esterace: negative   (Normal Range: Negative)

## 2010-06-13 NOTE — Assessment & Plan Note (Signed)
Summary: FOREIGN OBJECT IN FINGER/EVM   Vital Signs:  Patient Profile:   65 Years Old Female CC:      Splinter in her left middle finger / rwt Height:     66 inches (167.64 cm) O2 Sat:      96 % O2 treatment:    Room Air Temp:     97.3 degrees F oral Pulse rate:   92 / minute Pulse rhythm:   regular Resp:     20 per minute BP sitting:   133 / 73  (left arm)  Pt. in pain?   yes    Location:   hand    Intensity:   4    Type:       aching  Vitals Entered By: Levonne Spiller EMT-P (February 15, 2010 12:06 PM)              Is Patient Diabetic? No Comments Pt refused height and weight      Current Allergies: ! CLARITIN ! ZOCORHistory of Present Illness History from: patient Chief Complaint: Splinter in her left middle finger / rwt History of Present Illness: this patient presented today because she was having pain in her left middle finger. She reports that she believes that a foreign object was impaled 2 weeks ago in the finger. The patient reports that she tried to remove it but feels like she was unsuccessful. The patient reports that she still having pain and tenderness in the area and she can feel a small knot in the area of the finger. The patient reports no fever or chills or redness but pain over the area of the finger where she's been experiencing the symptoms.  REVIEW OF SYSTEMS Constitutional Symptoms      Denies fever, chills, night sweats, weight loss, weight gain, and fatigue.  Eyes       Denies change in vision, eye pain, eye discharge, glasses, contact lenses, and eye surgery. Ear/Nose/Throat/Mouth       Denies hearing loss/aids, change in hearing, ear pain, ear discharge, dizziness, frequent runny nose, frequent nose bleeds, sinus problems, sore throat, hoarseness, and tooth pain or bleeding.  Respiratory       Denies dry cough, productive cough, wheezing, shortness of breath, asthma, bronchitis, and emphysema/COPD.  Cardiovascular       Denies murmurs, chest  pain, and tires easily with exhertion.    Gastrointestinal       Denies stomach pain, nausea/vomiting, diarrhea, constipation, blood in bowel movements, and indigestion. Genitourniary       Denies painful urination, kidney stones, and loss of urinary control. Neurological       Denies paralysis, seizures, and fainting/blackouts. Musculoskeletal       Denies muscle pain, joint pain, joint stiffness, decreased range of motion, redness, swelling, muscle weakness, and gout.      Comments: see HPI Skin       Denies bruising, unusual mles/lumps or sores, and hair/skin or nail changes.  Psych       Denies mood changes, temper/anger issues, anxiety/stress, speech problems, depression, and sleep problems.  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  Past Medical History: Reviewed history from 03/20/2009 and no changes required. Allergic rhinitis Breast cancer, hx of Hypertension hyperlipidemia  obesity cataract- mild  lactose intolerant  hyperglycemia   hand surg- Dr Merlyn Lot  Past Surgical History: Reviewed history from 03/20/2009 and no changes required. colonoscopy- normal (11/2003) ? abn cardiac test Pelvic US- neg (02/2006) Nuclear stress- mild global hypokinesis, left  vent enlargement 911/2007) CT abd/ pelvis- kidney and hepatic cysts, some fibrosis at lung base (03/2006) Carotid US- non obstructive plaque, EF 48% (2007) tendon surgery in hand   Family History: Reviewed history from  06/22/2009 and no changes required. Father: deceased CHF, HTN Mother:  Siblings: brother deceased from massive MI uncle with lung cancer  sister with pancreatic cancer   Social History: Reviewed history from 09/03/2007 and no changes required. 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  former smoker 2006 Physical Exam General appearance: well developed, well nourished, no acute distress Head: normocephalic, atraumatic Eyes: conjunctivae and lids normal Pupils: equal, round, reactive to light Ears: normal, no lesions or deformities Nasal: mucosa pink, nonedematous, no septal deviation, turbinates normal Oral/Pharynx: tongue normal, posterior pharynx without erythema or exudate Neck: neck supple,  trachea midline, no masses Chest/Lungs: no rales, wheezes, or rhonchi bilateral, breath sounds equal without effort Heart: regular rate and  rhythm, no murmur Abdomen: soft, non-tender without obvious organomegaly Extremities: normal except left third finger mildly swollen and tender with a heart not palpated on the pad of the finger. Neurological: grossly intact and non-focal Skin: no obvious rashes or lesions MSE: oriented to time, place, and person Assessment  Assessed HYPERTENSION as unchanged - Standley Dakins MD New Problems: FOREIGN BODY, FINGER (ICD-915.6)   Patient Education: Patient and/or caregiver instructed in the following: Tylenol prn. wound care Demonstrates willingness to comply.  Plan New Orders: [facility]- Est Level  3 [99213] Incision and Removal of Foreign Body, subq tissues; simple [10120] T-Clinical pathology consultation ,limited ( Professional Consult) [80500] Follow Up: Follow up in 2-3 days if no improvement, Follow up on an as needed basis, Follow up with Primary Physician  The patient and/or caregiver has been counseled thoroughly with regard to medications prescribed including dosage, schedule,  interactions, rationale for use, and possible side effects and they verbalize understanding.  Diagnoses and expected course of recovery discussed and will return if not improved as expected or if the condition worsens. Patient and/or caregiver verbalized understanding.   PROCEDURE:  Lesion Removal Site: left 3rd finger Size: 3 mm Number of Lesions: 1 Anesthesia: lidocaine without epi Procedure: with the patient's permission, anesthesia was given with lidocaine without epinephrine, a small incision was made in the left third finger and a small 3 mm black round piece of material was removed. Hemostasis was obtained by pressure, a single 4-0 suture was placed in the finger to close the skin and then he wound was bandaged appropriately after being cleansed and antibiotic solution applied.  Care instructions were provided to the patient who verbalized understanding.  Signs/Symptoms: foreign body left third finger Physical evidence: small mass object removed from the area Disposition: the object was sent to pathology for evaluation Specimen sent to lab for evaluation.  Patient Instructions: 1)  Take 650-1000mg  of Tylenol every 4-6 hours as needed for relief of pain or comfort of fever AVOID taking more than 4000mg   in a 24 hour period (can cause liver damage in higher doses). 2)  Return in 4  days to have the suture removed.  There was only 1 suture used to close the skin in your finger. 3)  Keep the wound clean and use antibiotic ointment on the area to keep it clean.  4)  Follow up with your primary care physician.  5)  The patient was informed that there is no on-call Emalea Mix or services available at this clinic during off-hours (when the clinic is closed).  If the patient developed a problem or concern that required immediate attention, the patient was advised to go the the nearest available urgent care or emergency department for medical care.  The patient verbalized understanding.    6)  It was  clearly explained to the patient that this [facility] is not intended to be a primary care clinic.  The patient is always better served by the continuity of care and the Delante Karapetyan/patient relationships developed with their dedicated primary care Salwa Bai.  The patient was told to be sure to follow up as soon as possible with their primary care Myrl Bynum to discuss treatments received and to receive further examination and testing.  The patient verbalized understanding. The will f/u with PCP ASAP.  7)  The risks, benefits and possible side effects were clearly explained and discussed with the patient.  The patient verbalized clear understanding.  The patient was given instructions to return if symptoms don't improve, worsen or new changes develop.  If it is not during clinic hours and the patient cannot get back to this clinic then the patient was told to seek medical care at an available urgent care or emergency department.  The patient verbalized understanding.    Orders Added: 1)  [facility]- Est Level  3 [16109] 2)  Incision and Removal of Foreign Body, subq tissues; simple [10120] 3)  T-Clinical pathology consultation ,limited ( Professional Consult) [80500]

## 2010-06-13 NOTE — Progress Notes (Signed)
Summary: splinter in finger   Phone Note Call from Patient Call back at Home Phone [phone]   Caller: Patient Call For: Judith Part MD Summary of Call: Patient walked in stating that she had gotten a splinter in her hand about two weeks ago. She came in today stating that she doesn't think she got it all out and that her finger is very sore and tender at the site where the splinter was. The area is not red, or hot. She has no other symptoms going on. She wanted to be seen today while she was here, but there wasn't anything available with any of the Drs. I told her about the [facility] and she was fine with going there.  Initial call taken by: Melody Comas,  February 15, 2010 11:01 AM  Follow-up for Phone Call        she needs to have it checked out - and urgent care is fine Follow-up by: Judith Part MD,  February 15, 2010 11:29 AM  Additional Follow-up for Phone Call Additional follow up Details #1::        Left message for patient to call back. Lewanda Rife LPN  February 15, 2010 1:21 PM     Additional Follow-up for Phone Call Additional follow up Details #2::    Patient called to let you know that she did go to the [facility] clinic. She said that they removed it and put a couple of stitches in her finger. She says that he finger is feeling much better now and is just a little sore.   good!- thanks for the update- I got the note  Follow-up by: Melody Comas,  February 16, 2010 8:48 AM

## 2010-06-13 NOTE — Assessment & Plan Note (Signed)
Summary: FLU SHOT  CYD   Nurse Visit   Allergies: 1)  ! Claritin 2)  ! Zocor  Immunizations Administered:  Influenza Vaccine # 1:    Vaccine Type: Fluvax 3+    Site: left deltoid    Mfr: GlaxoSmithKline    Dose: 0.5 ml    Route: IM    Given by: Mervin Hack CMA (AAMA)    Exp. Date: 11/10/2009    Lot #: [id number]    VIS given: 12/05/06 version given June 16, 2009.  Flu Vaccine Consent Questions:    Do you have a history of severe allergic reactions to this vaccine? no    Any prior history of allergic reactions to egg and/or gelatin? no    Do you have a sensitivity to the preservative Thimersol? no    Do you have a past history of Guillan-Barre Syndrome? no    Do you currently have an acute febrile illness? no    Have you ever had a severe reaction to latex? no    Vaccine information given and explained to patient? yes    Are you currently pregnant? no  Orders Added: 1)  Flu Vaccine 55yrs + [90658] 2)  Admin 1st Vaccine [09323]

## 2010-06-13 NOTE — Assessment & Plan Note (Signed)
Summary: 6 MONTH FOLLOW UP/[facility]   Vital Signs:  Patient profile:   65 year old female Height:      66 inches Weight:      216.75 pounds BMI:     35.11 Temp:     98 degrees F oral Pulse rate:   84 / minute Pulse rhythm:   regular BP sitting:   124 / 78  (left arm) Cuff size:   large  Vitals Entered By: Lewanda Rife LPN (December 21, 2009 11:49 AM) CC: follow-up visit   History of Present Illness: here to f/u for HTN and lipids and hyperglycemia   is ok except for allergies- seasonal takes zyrtec - and that helps   lipids good with trig 85 and HDL 64 and LDL 110 (up from 78) may have eaten something fried or greasy   has not exercised as much - had ankle injury and then dealing with family  ankle bump was just a cyst    nl tsh   nl renal prof gluc 98  wt down 2 lb  HTN well controlled with 124/78 today  Allergies: 1)  ! Claritin 2)  ! Zocor  Past History:  Past Medical History: Last updated: 03/20/2009 Allergic rhinitis Breast cancer, hx of Hypertension hyperlipidemia  obesity cataract- mild  lactose intolerant  hyperglycemia   hand surg- Dr Merlyn Lot  Past Surgical History: Last updated: 03/20/2009 colonoscopy- normal (11/2003) ? abn cardiac test Pelvic US- neg (02/2006) Nuclear stress- mild global hypokinesis, left  vent enlargement 911/2007) CT abd/ pelvis- kidney and hepatic cysts, some fibrosis at lung base (03/2006) Carotid US- non obstructive plaque, EF 48% (2007) tendon surgery in hand   Family History: Last updated: 2009-07-19 Father: deceased CHF, HTN Mother:  Siblings: brother deceased from massive MI uncle with lung cancer  sister with pancreatic cancer   Social History: Last updated: 09/03/2007 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  former smoker 2006  Risk Factors: Smoking Status: quit (11/26/2006)  Review of Systems General:  Denies fatigue, fever, and loss of  appetite. Eyes:  Denies blurring and eye irritation. CV:  Denies chest pain or discomfort, palpitations, and shortness of breath with exertion. Resp:  Denies cough, shortness of breath, and wheezing. GI:  Denies indigestion, nausea, and vomiting. GU:  Denies urinary frequency. MS:  Denies muscle aches, cramps, and muscle weakness. Derm:  Denies itching, lesion(s), poor wound healing, and rash. Neuro:  Denies numbness and tingling. Endo:  Denies excessive thirst and excessive urination. Heme:  Denies abnormal bruising and bleeding.  Physical Exam  General:  overweight but generally well appearing   Head:  normocephalic, atraumatic, and no abnormalities observed.   Eyes:  vision grossly intact, pupils equal, pupils round, and pupils reactive to light.  no conjunctival pallor, injection or icterus  Ears:  R ear normal and L ear normal.   Nose:  nares are boggy and slt congested with clear d/c Mouth:  pharynx pink and moist.   Neck:  supple with full rom and no masses or thyromegally, no JVD or carotid bruit  Chest Wall:  No deformities, masses, or tenderness noted. Lungs:  normal respiratory effort.   Heart:  Normal rate and regular rhythm. S1 and S2 normal without gallop, murmur, click, rub or other extra sounds. Abdomen:  Bowel sounds positive,abdomen soft and non-tender without masses, organomegaly or hernias noted.  Msk:  No deformity or scoliosis noted of thoracic or lumbar spine.   Pulses:  R  and L carotid,radial,femoral,dorsalis pedis and posterior tibial pulses are full and equal bilaterally Extremities:  no clubbing, cyanosis, or edema Neurologic:  alert & oriented X3 and sensation intact to light touch.   Skin:  Intact without suspicious lesions or rashes Cervical Nodes:  No lymphadenopathy noted Psych:  normal affect, talkative and pleasant    Impression & Recommendations:  Problem # 1:  HYPERTENSION (ICD-401.9) Assessment Unchanged  no change in medicines with good  bp  rev health habits and labs  Her updated medication list for this problem includes:    Benazepril-hydrochlorothiazide 20-12.5 Mg Tabs (Benazepril-hydrochlorothiazide) .Marland Kitchen... Take 2 by mouth daily    Amlodipine Besylate 5 Mg Tabs (Amlodipine besylate) .Marland Kitchen... Take one by mouth daily  BP today: 124/78 Prior BP: 120/82 (08/25/2009)  Labs Reviewed: K+: 4.3 (12/16/2009) Creat: : 0.9 (12/16/2009)   Chol: 191 (12/16/2009)   HDL: 64.20 (12/16/2009)   LDL: 110 (12/16/2009)   TG: 85.0 (12/16/2009)  Orders: Prescription Created Electronically [phone])  Problem # 2:  ALLERGIC RHINITIS (ICD-477.9) Assessment: Unchanged  flonase - continues / works well  Her updated medication list for this problem includes:    Flonase 50 Mcg/act Susp (Fluticasone propionate) .Marland Kitchen... 2 sprays in each nostril daily as needed  Orders: Prescription Created Electronically [phone])  Problem # 3:  HYPERLIPIDEMIA (ICD-272.4) Assessment: Unchanged  fairly well controlled with diet  rev low sat fat diet in detail  Labs Reviewed: SGOT: 22 (12/16/2009)   SGPT: 16 (12/16/2009)   HDL:64.20 (12/16/2009), 66.10 (06/16/2009)  LDL:110 (12/16/2009), 78 (08/65/7846)  Chol:191 (12/16/2009), 163 (06/16/2009)  Trig:85.0 (12/16/2009), 96.0 (06/16/2009)  Orders: Prescription Created Electronically [phone])  Problem # 4:  FASTING HYPERGLYCEMIA (ICD-790.29) Assessment: Improved  is ok with diet control  rev low simple carb diet  Labs Reviewed: Creat: 0.9 (12/16/2009)     Orders: Prescription Created Electronically [phone])  Complete Medication List: 1)  Benazepril-hydrochlorothiazide 20-12.5 Mg Tabs (Benazepril-hydrochlorothiazide) .... Take 2 by mouth daily 2)  Travatan 0.004 % Soln (Travoprost) .... One gtt q hs 3)  Adult Aspirin Low Strength 81 Mg Tbdp (Aspirin) .... Take one by mouth daily 4)  Amlodipine Besylate 5 Mg Tabs (Amlodipine besylate) .... Take one by mouth daily 5)  Flonase 50 Mcg/act Susp (Fluticasone  propionate) .... 2 sprays in each nostril daily as needed 6)  Omega 3  .... Take one by mouth daily 7)  Multivitamins Tabs (Multiple vitamin) .... One by mouth daily 8)  Vitamin D 1000 Unit Tabs (Cholecalciferol) .... Daily 9)  Coenzyme Q10 100 Mg Caps (Coenzyme q10) .... One daily 10)  Vitamin B-12 1000 Mcg Tabs (Cyanocobalamin) .... Take one daily  Patient Instructions: 1)  no change in medicines  2)  labs and blood pressure look good today  3)  get back to exercise gradually  4)  schedule 30 minute check up in 6 months 5)  I sent flonase to your pharmacy Prescriptions: FLONASE 50 MCG/ACT  SUSP (FLUTICASONE PROPIONATE) 2 sprays in each nostril daily as needed  #1 mdi x 11   Entered and Authorized by:   Judith Part MD   Signed by:   Judith Part MD on 12/21/2009   Method used:   Electronically to        [id number] (retail)       [address]       [place], [place]  [address]       Ph: [phone]       Fax: [phone]   RxID:  [id number]   Current Allergies (reviewed today): ! CLARITIN ! ZOCOR

## 2010-06-13 NOTE — Letter (Signed)
Summary: Results Follow up Letter  [place] [facility]  [address] [place], [place] [address]   Phone: [phone]  Fax: [phone]    03/30/2010 MRN: [id number]  Michele Bryant [address] [place], [place]  [address]  Dear Ms. Greta Doom,  The following are the results of your recent test(s):  Test         Result    Pap Smear:        Normal _____  Not Normal _____ Comments: ______________________________________________________ Cholesterol: LDL(Bad cholesterol):         Your goal is less than:         HDL (Good cholesterol):       Your goal is more than: Comments:  ______________________________________________________ Mammogram:        Normal __X___  Not Normal _____ Comments: Repeat in 1 year  ___________________________________________________________________ Hemoccult:        Normal _____  Not normal _______ Comments:    _____________________________________________________________________ Other Tests:    We routinely do not discuss normal results over the telephone.  If you desire a copy of the results, or you have any questions about this information we can discuss them at your next office visit.   Sincerely,     Janee Morn, CMA for Dr. Roxy Manns

## 2010-06-13 NOTE — Assessment & Plan Note (Signed)
Summary: INTESTINAL GAS/DLO   Vital Signs:  Patient profile:   65 year old female Height:      66 inches Weight:      218.38 pounds BMI:     35.37 Temp:     98.0 degrees F oral Pulse rate:   96 / minute Pulse rhythm:   regular BP sitting:   120 / 82  (left arm) Cuff size:   large  Vitals Entered By: Linde Gillis CMA Duncan Dull) (August 25, 2009 10:38 AM) CC: intestinal gas   History of Present Illness: 65 yo female new to me here for worsening gas.  Started exercising and eating better for weight loss--down 10 pounds since last office visit! Very happy, active and likes her new lifestyle. She has noticed, however that she is more gasy. She is also having 2-3 BMS per day, normal consistency.  Prior to a few weeks ago, she had one BM per day for as long as she can remember.  No blood in stool. No mucous in stool. No abdominal pain.  No fevers, chills.  Pertinent PMH reviewed, had normal colonscopy in 2005.  Current Medications (verified): 1)  Benazepril-Hydrochlorothiazide 20-12.5 Mg  Tabs (Benazepril-Hydrochlorothiazide) .... Take 2 By Mouth Daily 2)  Travatan 0.004 %  Soln (Travoprost) .... One Gtt Q Hs 3)  Adult Aspirin Low Strength 81 Mg  Tbdp (Aspirin) .... Take One By Mouth Daily 4)  Amlodipine Besylate 5 Mg  Tabs (Amlodipine Besylate) .... Take One By Mouth Daily 5)  Flonase 50 Mcg/act  Susp (Fluticasone Propionate) .... 2 Sprays in Each Nostril Daily As Needed 6)  Omega 3 .... Take One By Mouth Daily 7)  Multivitamins  Tabs (Multiple Vitamin) .... One By Mouth Daily 8)  Vitamin D 1000 Unit Tabs (Cholecalciferol) .... Daily 9)  Coenzyme Q10 100 Mg Caps (Coenzyme Q10) .... One Daily 10)  Vitamin B-12 1000 Mcg Tabs (Cyanocobalamin) .... Take One Daily  Allergies: 1)  ! Claritin 2)  ! Zocor  Review of Systems      See HPI General:  Denies chills, fever, and malaise. GI:  Denies abdominal pain, bloody stools, loss of appetite, nausea, and vomiting.  Physical  Exam  General:  Well-developed,well-nourished,in no acute distress; alert,appropriate and cooperative throughout examination Abdomen:  Bowel sounds positive,abdomen soft and non-tender without masses, organomegaly or hernias , no renal bruits  Extremities:  no clubbing, cyanosis, or edema Psych:  Cognition and judgment appear intact. Alert and cooperative with normal attention span and concentration. No apparent delusions, illusions, hallucinations   Impression & Recommendations:  Problem # 1:  INTESTINAL GAS (ICD-787.3) Assessment New Time spent with patient 25 minutes, more than 50% of this time was spent discussing this issue. Reassurance provided, no red flag symptoms. Most likely due to changes in diet- leafy veggies, brocoli.  Advised trying GasX or BeanO.  Definitely do not stop eating veggies! Also can try some yogurt with live active cultures.  Complete Medication List: 1)  Benazepril-hydrochlorothiazide 20-12.5 Mg Tabs (Benazepril-hydrochlorothiazide) .... Take 2 by mouth daily 2)  Travatan 0.004 % Soln (Travoprost) .... One gtt q hs 3)  Adult Aspirin Low Strength 81 Mg Tbdp (Aspirin) .... Take one by mouth daily 4)  Amlodipine Besylate 5 Mg Tabs (Amlodipine besylate) .... Take one by mouth daily 5)  Flonase 50 Mcg/act Susp (Fluticasone propionate) .... 2 sprays in each nostril daily as needed 6)  Omega 3  .... Take one by mouth daily 7)  Multivitamins Tabs (Multiple vitamin) .... One  by mouth daily 8)  Vitamin D 1000 Unit Tabs (Cholecalciferol) .... Daily 9)  Coenzyme Q10 100 Mg Caps (Coenzyme q10) .... One daily 10)  Vitamin B-12 1000 Mcg Tabs (Cyanocobalamin) .... Take one daily  Current Allergies (reviewed today): ! CLARITIN ! ZOCOR Last Colonoscopy:  normal (12/06/2003 9:59:05 AM) Colonoscopy Next Due:  10 yr

## 2010-06-13 NOTE — Assessment & Plan Note (Signed)
Summary: flu/tower/alc   Nurse Visit   Allergies: 1)  ! Claritin 2)  ! Zocor  Orders Added: 1)  Flu Vaccine 74yrs + MEDICARE PATIENTS [Q2039] 2)  Administration Flu vaccine - MCR [G0008]   Flu Vaccine Consent Questions     Do you have a history of severe allergic reactions to this vaccine? no    Any prior history of allergic reactions to egg and/or gelatin? no    Do you have a sensitivity to the preservative Thimersol? no    Do you have a past history of Guillan-Barre Syndrome? no    Do you currently have an acute febrile illness? no    Have you ever had a severe reaction to latex? no    Vaccine information given and explained to patient? yes    Are you currently pregnant? no    Lot Number:[id number]   Exp Date:11/11/2010   Site Given  Left Deltoid IM

## 2010-06-13 NOTE — Progress Notes (Signed)
Summary: MRI, right ankle  Phone Note From Other Clinic   Caller: Provider, [facility] Call For: Dr. 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  Consider synovial ganglion, peripheral nerve sheath tumor, fibrous hystiocytoma or less likely possible complex ganglion.  Report should be on EMR soon. Initial call taken by: Delilah Shan CMA Duncan Dull),  July 07, 2009 8:18 AM  Follow-up for Phone Call        noted - will review MRI and arrange dispo Follow-up by: Hannah Beat MD,  July 07, 2009 9:20 AM

## 2010-06-13 NOTE — Miscellaneous (Signed)
   Clinical Lists Changes  Observations: Added new observation of MAMMO DUE: 04/2011 (03/30/2010 10:18) Added new observation of MAMMOGRAM: normal (03/29/2010 10:18)      Preventive Care Screening  Mammogram:    Date:  03/29/2010    Next Due:  04/2011    Results:  normal

## 2010-06-13 NOTE — Medication Information (Signed)
Summary: Second to Ashby Dawes  Second to [place]   Imported By: Lester  10/29/2009 10:11:56  _____________________________________________________________________  External Attachment:    Type:   Image     Comment:   External Document

## 2010-06-26 ENCOUNTER — Other Ambulatory Visit: Payer: Self-pay | Admitting: Family Medicine

## 2010-06-26 ENCOUNTER — Ambulatory Visit (INDEPENDENT_AMBULATORY_CARE_PROVIDER_SITE_OTHER)
Admission: RE | Admit: 2010-06-26 | Discharge: 2010-06-26 | Disposition: A | Discharge status: Home / Self Care | Payer: Medicare Other | Source: Ambulatory Visit | Attending: Family Medicine | Admitting: Family Medicine

## 2010-06-26 ENCOUNTER — Encounter: Payer: Self-pay | Admitting: Family Medicine

## 2010-06-26 ENCOUNTER — Other Ambulatory Visit (HOSPITAL_COMMUNITY)
Admission: RE | Admit: 2010-06-26 | Discharge: 2010-06-26 | Disposition: A | Discharge status: Home / Self Care | Payer: Medicare Other | Source: Ambulatory Visit | Attending: Family Medicine | Admitting: Family Medicine

## 2010-06-26 ENCOUNTER — Encounter (INDEPENDENT_AMBULATORY_CARE_PROVIDER_SITE_OTHER): Payer: Medicare Other | Admitting: Family Medicine

## 2010-06-26 DIAGNOSIS — Z124 Encounter for screening for malignant neoplasm of cervix: Secondary | ICD-10-CM | POA: Insufficient documentation

## 2010-06-26 DIAGNOSIS — R7309 Other abnormal glucose: Secondary | ICD-10-CM

## 2010-06-26 DIAGNOSIS — Z853 Personal history of malignant neoplasm of breast: Secondary | ICD-10-CM

## 2010-06-26 DIAGNOSIS — M549 Dorsalgia, unspecified: Secondary | ICD-10-CM

## 2010-06-26 DIAGNOSIS — E785 Hyperlipidemia, unspecified: Secondary | ICD-10-CM

## 2010-06-26 DIAGNOSIS — I10 Essential (primary) hypertension: Secondary | ICD-10-CM

## 2010-06-26 DIAGNOSIS — R109 Unspecified abdominal pain: Secondary | ICD-10-CM

## 2010-06-26 DIAGNOSIS — Z1159 Encounter for screening for other viral diseases: Secondary | ICD-10-CM | POA: Insufficient documentation

## 2010-06-26 DIAGNOSIS — Z01419 Encounter for gynecological examination (general) (routine) without abnormal findings: Secondary | ICD-10-CM

## 2010-06-26 DIAGNOSIS — Z862 Personal history of diseases of the blood and blood-forming organs and certain disorders involving the immune mechanism: Secondary | ICD-10-CM

## 2010-06-26 LAB — CBC WITH DIFFERENTIAL/PLATELET
Basophils Absolute: 0 10*3/uL (ref 0.0–0.1)
Basophils Relative: 0.7 % (ref 0.0–3.0)
Eosinophils Absolute: 0.1 10*3/uL (ref 0.0–0.7)
MCHC: 33.5 g/dL (ref 30.0–36.0)
MCV: 103.1 fl — ABNORMAL HIGH (ref 78.0–100.0)
Monocytes Absolute: 0.3 10*3/uL (ref 0.1–1.0)
Neutrophils Relative %: 49.9 % (ref 43.0–77.0)
Platelets: 294 10*3/uL (ref 150.0–400.0)
RDW: 14.3 % (ref 11.5–14.6)

## 2010-06-26 LAB — LIPID PANEL
HDL: 71.4 mg/dL (ref >39.00)
Total CHOL/HDL Ratio: 3
Triglycerides: 69 mg/dL (ref 0.0–149.0)

## 2010-06-26 LAB — RENAL FUNCTION PANEL
BUN: 22 mg/dL (ref 6–23)
CO2: 29 mEq/L (ref 19–32)
Calcium: 9.2 mg/dL (ref 8.4–10.5)
Creatinine, Ser: 1.1 mg/dL (ref 0.4–1.2)

## 2010-06-26 LAB — HEPATIC FUNCTION PANEL
Bilirubin, Direct: 0.1 mg/dL (ref 0.0–0.3)
Total Protein: 7.1 g/dL (ref 6.0–8.3)

## 2010-06-26 LAB — HEMOGLOBIN A1C: Hgb A1c MFr Bld: 5.1 % (ref 4.6–6.5)

## 2010-06-26 LAB — HM PAP SMEAR

## 2010-06-28 ENCOUNTER — Encounter: Payer: Self-pay | Admitting: Family Medicine

## 2010-06-30 ENCOUNTER — Encounter (INDEPENDENT_AMBULATORY_CARE_PROVIDER_SITE_OTHER): Payer: Self-pay | Admitting: *Deleted

## 2010-07-05 NOTE — Letter (Signed)
Summary: Generic Letter  [place] [facility]  [address] [place], [place] [address]   Phone: [phone]  Fax: [phone]    06/30/2010    Michele Bryant [address] [facility] [place], [place]  [address]  [place]     Dear [name],     Your pap smear was normal, please repeat this  screening in one year    Sincerely,   Liane Comber CMA (AAMA)

## 2010-07-05 NOTE — Assessment & Plan Note (Signed)
Summary: 30 MIN APPT 6 MONTH CHECK UP/ [facility]   Vital Signs:  Patient profile:   65 year old female Height:      66.5 inches Weight:      223.75 pounds BMI:     35.70 Temp:     98 degrees F oral Pulse rate:   80 / minute Pulse rhythm:   regular BP sitting:   130 / 76  (left arm) Cuff size:   large  Vitals Entered By: Lewanda Rife LPN (June 26, 2010 9:47 AM) CC: 30 min 6 mth ck up LMP 16 yrs ago   History of Present Illness: here for check up of chronic med problems  wt is down 1 lb with bmi 35  is doing well overall  tired - moved back from [place] last night    HTN in good conttrol with 130/76 today   takes fair care of herself - needs better diet and exercise  has been taking care of her sister- cancer for 1 year pancreatic  friend died recently -- and that was hard on her  is taking care of everyone else - no time for herself     pap 09  ? recent pelvic pain- in R lower quadrant  ? if that is from her back or not- saw Dr Reece Agar here   mam 11/11 nl  self exam-- no lumps or problems  personal hx of breast cancer  colonosc 7/05 nl -- 10 year follow up was recommended   lipids due for check  Last Lipid ProfileCholesterol: 191 (12/16/2009 9:09:09 AM)HDL:  64.20 (12/16/2009 9:09:09 AM)LDL:  110 (12/16/2009 9:09:09 AM)Triglycerides:  Last Liver profileSGOT:  22 (12/16/2009 9:09:09 AM)SPGT:  16 (12/16/2009 9:09:09 AM)T. 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 Eyes:  Denies blurring and eye irritation. ENT:  Denies decreased hearing. CV:  Denies chest pain or discomfort, lightheadness, palpitations, and shortness of breath with exertion. Resp:  Denies cough and shortness of breath. GI:  Complains of abdominal pain; denies bloody stools, change in bowel habits, constipation, indigestion, nausea, and vomiting. GU:  Denies abnormal vaginal bleeding, discharge, dysuria, hematuria, and urinary frequency. MS:  Denies joint redness, joint swelling, muscle aches, and cramps. Derm:  Denies itching, lesion(s), poor wound healing, and rash. Neuro:  Denies  numbness and tingling. Psych:  Denies anxiety and depression. Endo:  Denies cold intolerance, excessive thirst, excessive urination, and heat intolerance. Heme:  Denies abnormal bruising and bleeding.  Physical Exam  General:  overweight but generally well appearing   Head:  normocephalic, atraumatic, and no abnormalities observed.   Eyes:  vision grossly intact, pupils equal, pupils round, and pupils reactive to light.   Ears:  R ear normal and L ear normal.   Nose:  no nasal discharge.   Mouth:  pharynx pink and moist.    Neck:  supple with full rom and no masses or thyromegally, no JVD or carotid bruit  Chest Wall:  No deformities, masses, or tenderness noted. Breasts:  mastectomy on R - no M nl exam L - no M or skin change or nipple d/c Lungs:  normal respiratory effort.  CTAB, no c/w Heart:  Normal rate and regular rhythm. S1 and S2 normal without gallop, murmur, click, rub or other extra sounds. Abdomen:  Bowel sounds positive,abdomen soft and non-tender without masses, organomegaly or hernias noted. no renal bruits  Genitalia:  Normal introitus for age, no external lesions, no vaginal discharge, mucosa pink and moist, no vaginal or cervical lesions, no vaginal atrophy, no friaility or hemorrhage, normal uterus size and position, no adnexal masses or tenderness Msk:  No deformity or scoliosis noted of thoracic or lumbar spine.  no LS tenderness some R lumbar muscular tenderness nl rom  nl SLR Pulses:  2+ rad pulses Extremities:  no pedal edema Neurologic:  sensation intact, strength intact. Skin:  Intact without suspicious lesions or rashes Cervical Nodes:  No lymphadenopathy noted Axillary Nodes:  No palpable lymphadenopathy Inguinal Nodes:  No significant adenopathy Psych:  normal affect, talkative and pleasant    Impression & Recommendations:  Problem # 1:  BACK PAIN, RIGHT (ICD-724.5) Assessment Unchanged ongoing with pain in R pelvic area  nl pelvic exam with no tenderness check LS x ray- ? radiculopathy Her updated medication list for this problem includes:    Adult Aspirin Low Strength 81 Mg Tbdp (Aspirin) .Marland Kitchen... Take one by mouth daily    Naprosyn 500 Mg Tabs (Naproxen) ..... One twice daily with food as needed pain    Flexeril 5 Mg Tabs (Cyclobenzaprine hcl) ..... One by mouth two times a day as needed muscle spasm  Orders: T-Lumbar Spine 2 Views (72100TC) Prescription Created Electronically [phone])  Problem # 2:  ABDOMINAL PAIN, LOWER (ICD-789.09) Assessment: Unchanged see above   Her updated medication list for this problem includes:    Adult Aspirin Low Strength 81 Mg Tbdp (Aspirin) .Marland Kitchen... Take one by mouth daily    Naprosyn 500 Mg Tabs (Naproxen) ..... One twice daily with food as needed pain    Flexeril 5 Mg Tabs (Cyclobenzaprine hcl) ..... One by mouth two times a day as needed muscle spasm  Orders: T-Lumbar Spine 2 Views (72100TC)  Problem # 3:  FASTING HYPERGLYCEMIA (ICD-790.29) Assessment: Unchanged  AIC today disc low glycemic diet  Orders: Venipuncture (02725) TLB-Lipid Panel (80061-LIPID) TLB-Renal Function Panel (80069-RENAL) TLB-CBC Platelet - w/Differential (85025-CBCD) TLB-Hepatic/Liver Function Pnl (80076-HEPATIC) TLB-TSH (Thyroid Stimulating Hormone) (84443-TSH) TLB-A1C / Hgb A1C (Glycohemoglobin) (83036-A1C)  Labs Reviewed: Creat: 0.9 (12/16/2009)     Problem # 4:  ANEMIA, MILD, HX OF (ICD-V12.3) Assessment: Unchanged last time nl check cbc and adv Orders: Venipuncture (36644) TLB-Lipid Panel (80061-LIPID) TLB-Renal Function Panel (80069-RENAL) TLB-CBC Platelet - w/Differential (85025-CBCD) TLB-Hepatic/Liver Function Pnl (80076-HEPATIC) TLB-TSH (Thyroid Stimulating Hormone) (84443-TSH) TLB-A1C / Hgb A1C (  Glycohemoglobin) (83036-A1C)  Problem # 5:  ROUTINE GYNECOLOGICAL EXAMINATION (ICD-V72.31) Assessment: Unchanged  exam with pap  no tenderness pelvic at all no M either  Orders: Obtaining Screening PAP Smear (E4540) Pelvic & Breast Exam ( Medicare)  (G0101)  Problem # 6:  HYPERLIPIDEMIA (ICD-272.4) Assessment: Unchanged  lab today on diet disc low sat fat diet in detail  Orders: Venipuncture (98119) TLB-Lipid Panel (80061-LIPID) TLB-Renal Function Panel (80069-RENAL) TLB-CBC Platelet - w/Differential (85025-CBCD) TLB-Hepatic/Liver Function Pnl (80076-HEPATIC) TLB-TSH (Thyroid Stimulating Hormone) (84443-TSH) TLB-A1C / Hgb A1C (Glycohemoglobin) (83036-A1C)  Labs Reviewed: SGOT: 22 (12/16/2009)   SGPT: 16  (12/16/2009)   HDL:64.20 (12/16/2009), 66.10 (06/16/2009)  LDL:110 (12/16/2009), 78 (14/78/2956)  Chol:191 (12/16/2009), 163 (06/16/2009)  Trig:85.0 (12/16/2009), 96.0 (06/16/2009)  Problem # 7:  BREAST CANCER, HX OF (ICD-V10.3) Assessment: Unchanged nl exam nl mammogram in fall adv to continue self exams   Complete Medication List: 1)  Benazepril-hydrochlorothiazide 20-12.5 Mg Tabs (Benazepril-hydrochlorothiazide) .... Take 2 by mouth daily 2)  Travatan 0.004 % Soln (Travoprost) .... One drop both eyes at bedtime 3)  Adult Aspirin Low Strength 81 Mg Tbdp (Aspirin) .... Take one by mouth daily 4)  Amlodipine Besylate 5 Mg Tabs (Amlodipine besylate) .... Take one by mouth daily 5)  Omega 3  .... Take one by mouth daily 6)  Vitamin D 1000 Unit Tabs (Cholecalciferol) .... Daily 7)  Coenzyme Q10 100 Mg Caps (Coenzyme q10) .... One daily 8)  Vitamin B-12 1000 Mcg Tabs (Cyanocobalamin) .... Take one daily 9)  Naprosyn 500 Mg Tabs (Naproxen) .... One twice daily with food as needed pain 10)  Flexeril 5 Mg Tabs (Cyclobenzaprine hcl) .... One by mouth two times a day as needed muscle spasm  Patient Instructions: 1)  if you are interested in shingles vaccine - call insurance co and let us know  2)  labs today  3)  low spine x ray today - ? to see if this explains pain  4)  try to take care of yourself  Prescriptions: NAPROSYN 500 MG TABS (NAPROXEN) one twice daily with food as needed pain  #60 x 0   Entered and Authorized by:   Judith Part MD   Signed by:   Judith Part MD on 06/26/2010   Method used:   Electronically to        [id number] (retail)       [address]       [place], [place]  [address]       Ph: [phone]       Fax: [phone]   RxID:   [id number]    Orders Added: 1)  Venipuncture [63875] 2)  TLB-Lipid Panel [80061-LIPID] 3)  TLB-Renal Function Panel [80069-RENAL] 4)  TLB-CBC Platelet - w/Differential [85025-CBCD] 5)   TLB-Hepatic/Liver Function Pnl [80076-HEPATIC] 6)  TLB-TSH (Thyroid Stimulating Hormone) [84443-TSH] 7)  TLB-A1C / Hgb A1C (Glycohemoglobin) [83036-A1C] 8)  T-Lumbar Spine 2 Views [72100TC] 9)  Prescription Created Electronically [G8553] 10)  Est. Patient Level V [64332] 11)  Obtaining Screening PAP Smear [Q0091] 12)  Pelvic & Breast Exam ( Medicare)  [G0101]    Current Allergies (reviewed today): No known allergies

## 2010-07-05 NOTE — Letter (Signed)
Summary: [facility]: Immunoassay Fecal Occult Blood (iFOB) Order Form  [place] [facility]  [address] [place], [place] [address]   Phone: [phone]  Fax: [phone]      [facility]: Immunoassay Fecal Occult Blood (iFOB) Order Form   June 28, 2010 MRN: [id number]   DORETHEA STRUBEL 1946/05/06   Physicican Name:______Tower___________________  Diagnosis Code:_________285.9 and V76.51_________________      Judith Part MD

## 2010-07-12 ENCOUNTER — Encounter (INDEPENDENT_AMBULATORY_CARE_PROVIDER_SITE_OTHER): Payer: Self-pay | Admitting: *Deleted

## 2010-07-12 ENCOUNTER — Other Ambulatory Visit: Payer: Self-pay | Admitting: Family Medicine

## 2010-07-12 ENCOUNTER — Other Ambulatory Visit: Payer: Medicare Other

## 2010-07-12 ENCOUNTER — Ambulatory Visit: Payer: Medicare Other | Admitting: Family Medicine

## 2010-07-12 DIAGNOSIS — Z1211 Encounter for screening for malignant neoplasm of colon: Secondary | ICD-10-CM

## 2010-07-12 DIAGNOSIS — D649 Anemia, unspecified: Secondary | ICD-10-CM

## 2010-07-13 LAB — FECAL OCCULT BLOOD, IMMUNOCHEMICAL: Fecal Occult Bld: NEGATIVE

## 2010-07-19 ENCOUNTER — Encounter: Payer: Self-pay | Admitting: Family Medicine

## 2010-07-19 ENCOUNTER — Ambulatory Visit (INDEPENDENT_AMBULATORY_CARE_PROVIDER_SITE_OTHER): Payer: Medicare Other | Admitting: Family Medicine

## 2010-07-19 DIAGNOSIS — M5137 Other intervertebral disc degeneration, lumbosacral region: Secondary | ICD-10-CM

## 2010-07-25 NOTE — Assessment & Plan Note (Signed)
Summary: back pain per Dr Odelia Gage   Vital Signs:  Patient profile:   65 year old female Height:      66.5 inches Temp:     98.7 degrees F oral Pulse rate:   68 / minute Pulse rhythm:   regular BP sitting:   130 / 80  (left arm) Cuff size:   large  Vitals Entered By: Linde Gillis CMA Duncan Dull) (July 19, 2010 12:21 PM) CC: back pain, referral from Dr. Milinda Antis, Back Pain Comments patient refused to weigh   History of Present Illness: 65 year old female:  Back, may have starte in her hip, noticed that t started in her hip.   Pleasant patient who I remember well presented with several complaints that she thought were mostly related to her hip, saw Dr. Reece Agar a few months ago.  Lumbar series reviewed, with DDD most notably L4-5 and L5-S1, but there is some degree of mild DDD throughout lumbar spine, and into the thoracic spine views seen.  Hip AP views seen appear to have adequate preservation of joint space.  Unable to tolerate Naprosyn. Also has tried some flexeril.  Not physically active recently, and dealt with a friend dying. Also sister with pancreatic CA and an 31 yo mother.   no numbness, tingling, bowel, or bladder incontinence.   Critical Exclusionary Diagnosis Criteria (CEDC) for Back Pain:      The patient denies a history of previous trauma.  She has no prior history of spinal surgery.  There are no symptoms to suggest infection, cauda equina, or psychosocial factors for back pain.  Cancer risk factors include age >50 yrs with new back pain and no improvement in low back pain after 4-6 weeks therapy.    Allergies (verified): No Known Drug Allergies  Past History:  Past medical, surgical, family and social histories (including risk factors) reviewed, and no changes noted (except as noted below).  Past Medical History: Reviewed history from 03/20/2009 and no changes required. Allergic rhinitis Breast cancer, hx of Hypertension hyperlipidemia  obesity cataract- mild    lactose intolerant  hyperglycemia   hand surg- Dr Merlyn Lot  Past Surgical History: Reviewed history from 03/20/2009 and no changes required. colonoscopy- normal (11/2003) ? abn cardiac test Pelvic US- neg (02/2006) Nuclear stress- mild global hypokinesis, left  vent enlargement 911/2007) CT abd/ pelvis- kidney and hepatic cysts, some fibrosis at lung base (03/2006) Carotid US- non obstructive plaque, EF 48% (2007) tendon surgery in hand   Family History: Reviewed history from 06/22/2009 and no changes required. Father: deceased CHF, HTN Mother:  Siblings: brother deceased from massive MI uncle with lung cancer  sister with pancreatic cancer   Social History: Reviewed history from 09/03/2007 and no changes required. 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 Otherwise the pertinent positives of the ROS are noted above.    Physical Exam  General:  GEN: Well-developed,well-nourished,in no acute distress; alert,appropriate and cooperative throughout examination HEENT: Normocephalic and atraumatic without obvious abnormalities. No apparent alopecia or balding. Ears, externally no deformities PULM: Breathing comfortably in no respiratory distress EXT: No clubbing, cyanosis, or edema PSYCH: Normally interactive. Cooperative during the interview. Pleasant. Friendly and conversant. Not anxious or  depressed appearing. Normal, full affect.  Msk:  Normal Greater trochanteric bursae Full hip ROM Negative Faber: some back pain, but not true pos minimally pos Reverse Faber Sciatic Notches: ttp Sensation to Gross touch WNL Sensation to pinpricnk WNL DTR 2+ knee  and ankle no clonus DP and PT pulses are normal B   Low Back Pain Physical Exam:    Inspection-deformity:     No    Palpation-spinal tenderness:   Yes       Location:   L5-S1    Motor Exam/Strength:         Left Ankle Dorsiflexion (L5,L4):     normal       Left Great Toe Dorsiflexion (L5,L4):     normal       Left Heel Walk (L5,some L4):     normal       Left Single Squat & Rise-Quads (L4):   normal       Left Toe Walk-calf (S1):       normal       Right Ankle Dorsiflexion (L5,L4):     normal       Right Great Toe Dorsiflexion (L5,L4):       normal       Right Heel Walk (L5,some L4):     normal       Right Single Squat & Rise Quads (L4):   normal       Right Toe Walk-calf (S1):       normal    Sensory Exam/Pinprick:        Left Medial Foot (L4):   normal       Left Dorsal Foot (L5):   normal       Left Lateral Foot (S1):   normal       Right Medial Foot (L4):   normal       Right Dorsal Foot (L5):   normal       Right Lateral Foot (S1):   normal    Reflexes:        Left Knee Jerk (L4):     normal       Left Ankle Reflex (S1):   normal       Right Knee Jerk:     normal       Right Ankle Reflex (S1):   normal    Straight Leg Raise (SLR):       Left Straight Leg Raise (SLR):   negative       Right Straight Leg Raise (SLR):   negative   Impression & Recommendations:  Problem # 1:  DEGENERATIVE DISC DISEASE, LUMBAR SPINE (ICD-722.52) Assessment New I tried to have a frank conversation with the patient.  Significant DDD with obesity, ultimate outcome likely fair. Few PT sessions, Mckenzie protocol  She is excited to start YOGA, which I think will help as much as anything. Needs weight loss and greater core stability.  >25 minutes spent in face to face time with patient, >50% spent in counselling or coordination of care: review of pathology, films, prognosis, rehab  motrin as needed   Orders: Physical Therapy Referral (PT)  Complete Medication List: 1)   Benazepril-hydrochlorothiazide 20-12.5 Mg Tabs (Benazepril-hydrochlorothiazide) .... Take 2 by mouth daily 2)  Travatan 0.004 % Soln (Travoprost) .... One drop both eyes at bedtime 3)  Adult Aspirin Low Strength 81 Mg Tbdp (Aspirin) .... Take one by mouth daily 4)  Amlodipine Besylate 5 Mg Tabs (Amlodipine besylate) .... Take one by mouth daily  5)  Omega 3  .... Take one by mouth daily 6)  Vitamin D 1000 Unit Tabs (Cholecalciferol) .... Daily 7)  Coenzyme Q10 100 Mg Caps (Coenzyme q10) .... One daily 8)  Vitamin B-12 1000 Mcg Tabs (Cyanocobalamin) .... Take one daily 9)  Flexeril 5 Mg Tabs (Cyclobenzaprine hcl) .... One by mouth two times a day as needed muscle spasm  Patient Instructions: 1)  Referral Appointment Information 2)  Day/Date: 3)  Time: 4)  Place/MD: 5)  Address: 6)  Phone/Fax: 7)  Patient given appointment information. Information/Orders faxed/mailed.    Orders Added: 1)  Physical Therapy Referral [PT] 2)  Est. Patient Level IV [09811]    Current Allergies (reviewed today): No known allergies

## 2010-08-16 LAB — BASIC METABOLIC PANEL
BUN: 20 mg/dL (ref 6–23)
Creatinine, Ser: 1.06 mg/dL (ref 0.4–1.2)
GFR calc Af Amer: >60 mL/min (ref >60)
GFR calc non Af Amer: 52 mL/min — ABNORMAL LOW (ref >60)
Potassium: 3.9 mEq/L (ref 3.5–5.1)

## 2010-08-16 LAB — POCT HEMOGLOBIN-HEMACUE: Hemoglobin: 11.9 g/dL — ABNORMAL LOW (ref 12.0–15.0)

## 2010-09-04 ENCOUNTER — Encounter: Payer: Self-pay | Admitting: Cardiovascular Disease

## 2010-09-07 ENCOUNTER — Ambulatory Visit (INDEPENDENT_AMBULATORY_CARE_PROVIDER_SITE_OTHER): Payer: Medicare Other | Admitting: Cardiovascular Disease

## 2010-09-07 ENCOUNTER — Encounter: Payer: Self-pay | Admitting: Cardiovascular Disease

## 2010-09-07 DIAGNOSIS — E785 Hyperlipidemia, unspecified: Secondary | ICD-10-CM

## 2010-09-07 DIAGNOSIS — I10 Essential (primary) hypertension: Secondary | ICD-10-CM

## 2010-09-07 DIAGNOSIS — Z853 Personal history of malignant neoplasm of breast: Secondary | ICD-10-CM

## 2010-09-07 DIAGNOSIS — I428 Other cardiomyopathies: Secondary | ICD-10-CM

## 2010-09-07 DIAGNOSIS — R079 Chest pain, unspecified: Secondary | ICD-10-CM | POA: Insufficient documentation

## 2010-09-07 NOTE — Assessment & Plan Note (Signed)
We have encouraged continued exercise, careful diet management in an effort to lose weight. 

## 2010-09-07 NOTE — Assessment & Plan Note (Signed)
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 She did not have radiation treatment. This will reduce the long-term effects from treatment.

## 2010-09-07 NOTE — Patient Instructions (Addendum)
You are doing well. No medication changes were made. We have scheduled you for a treadmill on Thursday AM at 9:45. Please call us if you have new issues that need to be addressed.     Exercise Test, Stress Test   An exercise stress test is a heart test (EKG) which is done while you are moving. You will walk on a treadmill. This test will tell your doctor how your heart does when it is forced to work harder and how much activity you can safely handle. A trained technician, a doctor, or physician assistant (PA) who specializes in heart disease will perform the test. WHAT SHOULD I WEAR? Shorts or athletic pants.  Comfortable tennis shoes.  Women need to wear a bra that allows patches to be put on under it.  WHAT WILL HAPPEN DURING THE TEST? An EKG cable will be attached to your waist. This cable is hooked up to patches, which look like round stickers stuck to your chest.  You will be asked to walk on the treadmill.  You will walk until you are too tired or until you are told to stop.  HOW LONG WILL THE TEST LAST? It may last 30 minutes to 1 hour. The timing depends on your physical condition and the condition of your heart.  Tell the doctor or PA right away if you have:  Chest pain.  Leg cramps.     Shortness of breath.  Dizziness.    WHAT HAPPENS AFTER THE TEST? You will rest for about 6 minutes. During this time, your technician will check your heart rhythm and blood pressure.  The testing equipment will be removed from your body and you can get dressed.  You may go home or back to your hospital room. You may keep doing all your usual activities as directed by your doctor.  FINDING OUT THE RESULTS OF YOUR TEST Ask your doctor when your test results will be ready. Make sure you follow up and get your test results. Document Released: 10/17/2007 Document Re-Released: 07/25/2009 [facility] Patient Information 2011 [place], [place].Call us as needed for an APPT.

## 2010-09-07 NOTE — Progress Notes (Signed)
   Patient ID: Michele Bryant, female    DOB: 02-17-1946, 65 y.o.   MRN: [id number]  HPI Comments: Michele Bryant is a very pleasant 65 year old woman with history of hypertension, breast cancer and chemotherapy in 1994 with mastectomy, remote history of a cardiomyopathy with subsequent reports suggesting this had resolved, stress test in 2005 which was normal by her report, presenting for new patient evaluation after recent episode of chest discomfort.  She was evaluated several days ago at urgent care. She had gone to a movie after having a chicken salad with spicy peppers onions. She felt some discomfort in her left chest like it was "something there". She could touch and reproduces the discomfort to some degree. She had some belching. Symptoms were 6-7/10 in intensity. She did have some shoulder discomfort, left jaw discomfort. She went and sought urgent care and by that time her symptoms were 5-6/10. She also continues to have mild discomfort today that she is able to reproduce with palpation above the left breast. She does report doing some stretching exercises and range of motion with legs to the chest and twisting of her upper body and wonders if she might have hurt something.  EKG at urgent care showed normal sinus rhythm with rate 76 beats per minute with no significant ST or T wave changes. She did have rare PVCs      Review of Systems  Constitutional: Negative.   HENT: Negative.   Eyes: Negative.   Respiratory: Negative.   Cardiovascular: Positive for chest pain.  Gastrointestinal: Negative.   Musculoskeletal: Negative.   Skin: Negative.   Neurological: Negative.   Hematological: Negative.   Psychiatric/Behavioral: Negative.   All other systems reviewed and are negative.   BP 128/70  Pulse 60  Ht 5\' 7"  (1.702 m)  Wt 219 lb 12.8 oz (99.701 kg)  BMI 34.43 kg/m2   Physical Exam  Nursing note and vitals reviewed. Constitutional: She is oriented to person, place, and time. She  appears well-developed and well-nourished.  HENT:  Head: Normocephalic.  Nose: Nose normal.  Mouth/Throat: Oropharynx is clear and moist.  Eyes: Conjunctivae are normal. Pupils are equal, round, and reactive to light.  Neck: Normal range of motion. Neck supple. No JVD present.  Cardiovascular: Normal rate, regular rhythm, S1 normal, S2 normal, normal heart sounds and intact distal pulses.  Exam reveals no gallop and no friction rub.   No murmur heard. Pulmonary/Chest: Effort normal and breath sounds normal. No respiratory distress. She has no wheezes. She has no rales. She exhibits no tenderness.  Abdominal: Soft. Bowel sounds are normal. She exhibits no distension. There is no tenderness.  Musculoskeletal: Normal range of motion. She exhibits no edema and no tenderness.  Lymphadenopathy:    She has no cervical adenopathy.  Neurological: She is alert and oriented to person, place, and time. Coordination normal.  Skin: Skin is warm and dry. No rash noted. No erythema.  Psychiatric: She has a normal mood and affect. Her behavior is normal. Judgment and thought content normal.         Assessment and Plan

## 2010-09-07 NOTE — Assessment & Plan Note (Signed)
There is no indication of heart failure on exam. We have not ordered any further testing at this time apart from a routine treadmill.

## 2010-09-07 NOTE — Assessment & Plan Note (Signed)
Chest pain is likely atypical in nature. Symptoms suggest chest wall pain. I suggested she try nostril anti-inflammatories if her pain persists including hot pack, light stretching, possibly even chiropractic. She is concerned about her heart and her heart history given a brother who died of an MI, father with CHF. We did offer a treadmill study and she has indicated that she would like to perform a treadmill prior to participating in regular exercise. We will schedule this  at her convenience for next Thursday.

## 2010-09-07 NOTE — Assessment & Plan Note (Signed)
Blood pressure is well controlled on today's visit.  

## 2010-09-14 ENCOUNTER — Ambulatory Visit (INDEPENDENT_AMBULATORY_CARE_PROVIDER_SITE_OTHER): Payer: Medicare Other | Admitting: Cardiovascular Disease

## 2010-09-14 DIAGNOSIS — R079 Chest pain, unspecified: Secondary | ICD-10-CM

## 2010-09-14 NOTE — Progress Notes (Deleted)

## 2010-09-14 NOTE — Progress Notes (Signed)
Treadmill ordered for recent epsiodes of chest pain.  Resting EKG shows NSR with rate of 76 bpm Resting blood pressure of 98/58 Stand bruce protocal was used.  Patient exercised for 7:00 min Peak heart rate of 154 bpm.  This was 100% of the maximum predicted heart rate (target heart rate 130). Achieved 10.1 METS No symptoms of chest pain or lightheadedness were reported at peak stress or in recovery.  Peak Blood pressure recorded was 170/50 Heart rate at 3 minutes in recovery was 103 bpm.  FINAL IMPRESSION: Normal exercise stress test. No significant EKG changes concerning for ischemia. Excellent exercise tolerance.

## 2010-09-29 NOTE — Assessment & Plan Note (Signed)
[facility] OFFICE NOTE   Michele Bryant, Michele Bryant                      MRN:          [id number]  DATE:03/28/2006                            DOB:          Jan 23, 1946    [name] returns today to discuss the findings of her studies.   Please see my comprehensive note from February 26, 2006.   Carotid Dopplers showed nonobstructive plaque in both internal carotid  arteries.  She had antegrade flow in both vertebrals.  This was obtained  because of her right carotid bruit.   Her stress Myoview showed her to exercise for 6 minutes with a very  accentuated blood pressure response to 200 systolic.  Stopped secondary to  reaching adequate end point of 103% of maximum heart rate.  Her ejection  fraction is 48% with mild global hypokinesia with evidence of mild left  ventricular enlargement, no sign of any scar or ischemia.  This was fairly  comparable to her study several years ago in [place].   2 D echocardiogram demonstrated ejection fraction of 40-50% with mild  diffuse left ventricular hypokinesia.  Left ventricular chamber size was  upper limits of normal.  There was no valvular disease.  Right-sided  function appeared to be normal.   Looking back at her chart, her LDL has been greater than 409 in the past.  Her HDL has actually been pretty good.   She has begun exercising over the last week or so.   Her blood pressure today is 150/83, her pulse is 91, her weight is 224.  Her  resting heart rate last time was 73.  The rest of her exam is unchanged.   I spent about 20 minutes explaining the results of the test and prophylactic  strategies for reducing her risk of congestive heart failure, not to mention  cardiovascular, cerebrovascular and general vascular events in the future.   After much discussion, she has agreed to the following plan:  1. Increase benazepril/hydrochlorothiazide to 40/25 mg q.a.m.  2. Add amlodipine 5 mg a day for systolic hypertension and blunting her      blood pressure response to exercise.  3. Begin simvastatin 20 mg a day.  Will follow up with this and LFTs in 6      weeks.  4. Continue aspirin 81 mg a day.  5. Exercise and weight loss, which she is doing.   I will plan on seeing her back in about 8 weeks to discuss the findings of  her blood work, check her weight and her blood pressure.     Thomas C. Daleen Squibb, MD, [facility]  Electronically Signed    TCW/MedQ  DD: 03/28/2006  DT: 03/28/2006  Job #: [id number]   cc:   Marne A. Milinda Antis, MD

## 2010-09-29 NOTE — Assessment & Plan Note (Signed)
[facility] OFFICE NOTE   [name], [name]                      MRN:          [id number]  DATE:02/26/2006                            DOB:          12-11-1945    CARDIOLOGY CONSULTATION:  I was asked by Dr. Milinda Antis to evaluate and establish  with Michele Bryant, a delightful 65 year old divorced African-American  female who recently moved here from [place].   HISTORY OF PRESENT ILLNESS:  Michele Bryant was diagnosed with breast cancer in  1993.  She had a right mastectomy and went into an experimental protocol  where she received twice the amount of chemotherapy.  She developed a non-  ischemic cardiomyopathy that was relatively asymptomatic.  She was followed  by cardiovascular consultants in [place], [place].  Her last objective  assessment of her cardiomyopathy was in July of 2005.  A stress exercise  study showed normal left ventricular function with an EF of 54%.  A previous  abnormality was no longer present, probably secondary to breast attenuation.  She had a normal exercise-gaited study as well.  A 2D echocardiogram at the  same time demonstrated an EF of 50% with normal wall motion and normal  chamber dimensions.  She also had normal right-sided structures and  function.  There was no important valvular disease.   She has a history of hypertension and this being treated with  Benazepril/HCTZ.   She is very anxious to get into an exercise program in the near future.  She  would like to lose some weight.   PAST MEDICAL HISTORY:  She has no known allergies.  She is currently on  Benazepril/HCTZ.  20/12.5 daily, Trasentine 1 drop each eye every night.   She does not smoke.  She drinks socially once or twice a month.  She drinks  a little bit of coffee.   She likes to exercise but has not been doing this regularly.   She has had a previous mastectomy on the right.  She has had left and right  knee torn cartilages.   FAMILY HISTORY:  Her  brother had a heart attack before age 4.  It sounds  like it was a sudden, maybe ruptured plaque event.   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  She is  divorced, has 3 children.   REVIEW OF SYSTEMS:  Are for seasonal allergies.  Otherwise all systems are  negative as questioned.   PHYSICAL EXAMINATION:  Her blood pressure today is 140/78, her pulse is 73  and regular.  She is 5 feet 7 inches and weighs 223 pounds.  Very pleasant.  HEENT:  Normocephalic and atraumatic.  PERRLA.  Extraocular movements  intact.  Sclerae are clear.  Facial symmetry is normal.  Dentition is  satisfactory.  Carotid upstrokes are equal bilaterally with a right carotid  bruit.  Thyromegaly is not enlarged.  There is no JVD.  Trachea is midline.  LUNGS:  Clear.  HEART:  A soft S1, S2 without gallop.  There is no murmur.  ABDOMEN:  Soft with good bowel sounds, no midline bruits, no hepatomegaly.  EXTREMITIES:  No cyanosis, clubbing but there is trace edema.  Pulses are  intact.   IMAGING:  EKG shows normal sinus rhythm with possible left atrial  enlargement.   ASSESSMENT:  1. Dyspnea on exertion with a history of a non-ischemic cardiomyopathy.      This may all be deconditioning or could be ischemic.  This is      particularly true with her family history and her right carotid bruit.      She also has hypertension, though her lipids have been remarkably good      in the past.  2. Right carotid bruit, asymptomatic.  3. History of a non-ischemic cardiomyopathy status post chemotherapy for      breast cancer in 1993.   PLAN:  1. 2D echocardiogram to assess left ventricular size and function.  2. Exercise rest test Myoview for pre-exercise clearance and followup.  3. Carotid Dopplers.   I had a long discussion with the patient.  We will have her come back to  review these studies once they are obtained.  I have asked her to start an   enteric-coated aspirin 81 mg a day.      Thomas C. Daleen Squibb, MD, [facility]     TCW/MedQ  DD:  02/26/2006  DT:  02/27/2006  Job #:  [id number]   cc:   Marne A. Milinda Antis, MD

## 2010-09-29 NOTE — Assessment & Plan Note (Signed)
[facility]                                 ON-CALL NOTE   Michele Bryant, Michele Bryant                      MRN:          [id number]  DATE:06/29/2006                            DOB:          [birth year]    TELEPHONE CONSULTATION:  Michele Bryant is a 65 year old woman with a history  of breast cancer and mastectomy.  She also has a presumed nonischemic  cardiomyopathy with an EF between 45-50%.  She is followed by Dr. Daleen Squibb.  She underwent a stress Myoview a few months ago and had an ejection  fraction of 48% with no scar or ischemia.  She called tonight saying  that she had just some mild twinges in her chest and also in her jaw.  These were very fleeting, lasting just seconds.  There was no relation  to exertion.  There was no shortness of breath, no nausea, no vomiting.  She thought it might be indigestion but wanted some reassurance.  She  remains quite active without any exertional symptoms.   I told her that this is unlikely to be cardiac and tried to reassure  her.  However, I also said that if she were to have more severe pain or  persistent pain, especially associated with shortness of breath or other  features that she was to get back to me or call 911 and present to the  emergency room for further evaluation.     Bevelyn Buckles. Bensimhon, MD  Electronically Signed    DRB/MedQ  DD: 06/29/2006  DT: 06/29/2006  Job #: [id number]

## 2011-03-08 ENCOUNTER — Other Ambulatory Visit: Payer: Self-pay | Admitting: Family Medicine

## 2011-03-08 DIAGNOSIS — Z1231 Encounter for screening mammogram for malignant neoplasm of breast: Secondary | ICD-10-CM

## 2011-04-03 ENCOUNTER — Ambulatory Visit
Admission: RE | Admit: 2011-04-03 | Discharge: 2011-04-03 | Disposition: A | Discharge status: Home / Self Care | Payer: Medicare Other | Source: Ambulatory Visit | Attending: Family Medicine | Admitting: Family Medicine

## 2011-04-03 DIAGNOSIS — Z1231 Encounter for screening mammogram for malignant neoplasm of breast: Secondary | ICD-10-CM

## 2011-04-09 ENCOUNTER — Encounter: Payer: Self-pay | Admitting: *Deleted

## 2011-04-12 ENCOUNTER — Other Ambulatory Visit: Payer: Self-pay | Admitting: Family Medicine

## 2011-04-18 ENCOUNTER — Ambulatory Visit (INDEPENDENT_AMBULATORY_CARE_PROVIDER_SITE_OTHER): Payer: Medicare Other

## 2011-04-18 DIAGNOSIS — Z23 Encounter for immunization: Secondary | ICD-10-CM

## 2011-05-21 ENCOUNTER — Other Ambulatory Visit: Payer: Self-pay | Admitting: *Deleted

## 2011-05-21 MED ORDER — BENAZEPRIL-HYDROCHLOROTHIAZIDE 20-12.5 MG PO TABS: 2.0000 | ORAL_TABLET | Freq: Every day | ORAL | Status: DC

## 2011-05-21 MED ORDER — AMLODIPINE BESYLATE 5 MG PO TABS: 5.0000 mg | ORAL_TABLET | Freq: Every day | ORAL | Status: DC

## 2011-05-21 NOTE — Telephone Encounter (Signed)
[facility] refill [facility] - let her know

## 2011-05-21 NOTE — Telephone Encounter (Signed)
Patient called requesting a refill on her mediation. Patient stated that the pharmacy told her that she needed to be seen for further refills. Patient has scheduled an appointment with you for 06/12/11.

## 2011-05-21 NOTE — Telephone Encounter (Signed)
Left message at both contact # for pt to call back.

## 2011-05-22 NOTE — Telephone Encounter (Signed)
Patient notified as instructed by telephone. 

## 2011-05-22 NOTE — Telephone Encounter (Signed)
Left vm for pt to callback 

## 2011-05-24 ENCOUNTER — Telehealth: Payer: Self-pay

## 2011-05-24 NOTE — Telephone Encounter (Signed)
Pt was in office with her mother and asked if she could get rx for Hydrocodone. On 05/22/11 thinks pulled her back helping her mother. Pt has tried heat to back and Naproxen on one day and then yesterday tried Meloxicam with no relief. Pt said she is not able to rest at night. Lower back pain on left that goes up to left shoulder. Pt said extremely painful. Offered pt appt but she said she would go to [facility].

## 2011-05-24 NOTE — Telephone Encounter (Signed)
I agree with advisement to go to UC if she declined appt here Thanks

## 2011-05-25 DIAGNOSIS — I10 Essential (primary) hypertension: Secondary | ICD-10-CM | POA: Diagnosis not present

## 2011-05-25 DIAGNOSIS — H409 Unspecified glaucoma: Secondary | ICD-10-CM | POA: Diagnosis not present

## 2011-05-25 DIAGNOSIS — M25519 Pain in unspecified shoulder: Secondary | ICD-10-CM | POA: Diagnosis not present

## 2011-05-25 DIAGNOSIS — M6020 Foreign body granuloma of soft tissue, not elsewhere classified, unspecified site: Secondary | ICD-10-CM | POA: Diagnosis not present

## 2011-05-29 DIAGNOSIS — H4011X0 Primary open-angle glaucoma, stage unspecified: Secondary | ICD-10-CM | POA: Diagnosis not present

## 2011-05-29 DIAGNOSIS — H26499 Other secondary cataract, unspecified eye: Secondary | ICD-10-CM | POA: Diagnosis not present

## 2011-06-12 ENCOUNTER — Encounter: Payer: Self-pay | Admitting: Family Medicine

## 2011-06-12 ENCOUNTER — Ambulatory Visit (INDEPENDENT_AMBULATORY_CARE_PROVIDER_SITE_OTHER): Payer: Medicare Other | Admitting: Family Medicine

## 2011-06-12 VITALS — BP 152/78 | HR 76 | Temp 97.9°F | Ht 67.0 in | Wt 222.2 lb

## 2011-06-12 DIAGNOSIS — Z862 Personal history of diseases of the blood and blood-forming organs and certain disorders involving the immune mechanism: Secondary | ICD-10-CM

## 2011-06-12 DIAGNOSIS — E785 Hyperlipidemia, unspecified: Secondary | ICD-10-CM

## 2011-06-12 DIAGNOSIS — I10 Essential (primary) hypertension: Secondary | ICD-10-CM

## 2011-06-12 DIAGNOSIS — Z8262 Family history of osteoporosis: Secondary | ICD-10-CM | POA: Insufficient documentation

## 2011-06-12 DIAGNOSIS — Z780 Asymptomatic menopausal state: Secondary | ICD-10-CM | POA: Insufficient documentation

## 2011-06-12 DIAGNOSIS — E669 Obesity, unspecified: Secondary | ICD-10-CM

## 2011-06-12 DIAGNOSIS — R7309 Other abnormal glucose: Secondary | ICD-10-CM

## 2011-06-12 LAB — COMPREHENSIVE METABOLIC PANEL
BUN: 18 mg/dL (ref 6–23)
CO2: 27 mEq/L (ref 19–32)
Calcium: 9.1 mg/dL (ref 8.4–10.5)
Chloride: 106 mEq/L (ref 96–112)
Creatinine, Ser: 1 mg/dL (ref 0.4–1.2)
GFR: 74.47 mL/min (ref >60.00)

## 2011-06-12 LAB — CBC WITH DIFFERENTIAL/PLATELET
Basophils Relative: 0.8 % (ref 0.0–3.0)
Eosinophils Absolute: 0.3 10*3/uL (ref 0.0–0.7)
Hemoglobin: 11.7 g/dL — ABNORMAL LOW (ref 12.0–15.0)
Lymphocytes Relative: 37.7 % (ref 12.0–46.0)
MCHC: 33.7 g/dL (ref 30.0–36.0)
Neutro Abs: 1.5 10*3/uL (ref 1.4–7.7)
RBC: 3.42 Mil/uL — ABNORMAL LOW (ref 3.87–5.11)

## 2011-06-12 LAB — LIPID PANEL
Cholesterol: 170 mg/dL (ref 0–200)
Triglycerides: 92 mg/dL (ref 0.0–149.0)

## 2011-06-12 LAB — TSH: TSH: 0.19 u[IU]/mL — ABNORMAL LOW (ref 0.35–5.50)

## 2011-06-12 MED ORDER — AMLODIPINE BESYLATE 5 MG PO TABS: 5.0000 mg | ORAL_TABLET | Freq: Every day | ORAL | Status: DC

## 2011-06-12 MED ORDER — ZOSTER VACCINE LIVE 19400 UNT/0.65ML ~~LOC~~ SOLR: 0.6500 mL | Freq: Once | SUBCUTANEOUS | Status: AC

## 2011-06-12 MED ORDER — BENAZEPRIL-HYDROCHLOROTHIAZIDE 20-12.5 MG PO TABS: 2.0000 | ORAL_TABLET | Freq: Every day | ORAL | Status: DC

## 2011-06-12 NOTE — Patient Instructions (Signed)
We will schedule bone density test at check out  Here is px for zoster vaccine to take to [facility] Try to get 1200-1500 mg of calcium per day with at least 1000 iu of vitamin D - for bone health  Labs today Work on weight loss with healthy choices/ decreased portions and exercise 5 d per week

## 2011-06-12 NOTE — Assessment & Plan Note (Signed)
Forgot med today Disc imp of compliance Is ok at home bp in fair control at this time  No changes needed  Disc lifstyle change with low sodium diet and exercise

## 2011-06-12 NOTE — Progress Notes (Signed)
Subjective:    Patient ID: Michele Bryant, female    DOB: [birth year], 66 y.o.   MRN: [id number]  HPI Here for check up of chronic medical conditions and to review health mt list   Is doing well = nothing new is going on   Not a lot of time to take care of herself  Is taking care of other people  Took care of sister with pancreatic ca - died Mother broke her hip - has to stay with her every day -- sometimes nephew helps  She seldom gets away   bp is 152/78    Today- did not take both of her meds today  At home on her meds - is 130s/ 70s  No cp or palpitations or headaches or edema  No side effects to medicines   On amlodipine and lotensin   Wt is up 5 lb with bmi of 34 Diet- is doing fair with that -lately convenience eating with no time - getting back on track  Taking food to her mother's house  Exercise - very little , hurt her back recently helping her mom / lift - is getting better- is doing exercises  Wants to get back to walking Knows she needs to loose wt - and make time   Wants to do lab today  Hyperglycemia mild past Lab Results  Component Value Date   HGBA1C 5.1 06/26/2010   due for check and all labs  Lipids due -diet controlled Lab Results  Component Value Date   CHOL 196 06/26/2010   HDL 71.40 06/26/2010   LDLCALC 111* 06/26/2010   LDLDIRECT 91.1 06/13/2006   TRIG 69.0 06/26/2010   CHOLHDL 3 06/26/2010     Hx of breast ca -- no reocurrences or problems Last mam 11/12 Self -no lumps or changes   Gyn- pap nl 2/12-nl  No hx of abn paps  No symptoms  No new partners    colonosc ok 7/05--with 10 year recall Stool card ok 2/12  Zoster status - had shingles and was supposed to get the vaccine  Was supposed to get at [facility] Needs to get the form Needs px   Patient Active Problem List  Diagnoses  . HYPERLIPIDEMIA  . OBESITY  . GLAUCOMA  . HYPERTENSION  . CARDIOMYOPATHY  . ALLERGIC RHINITIS  . BACK PAIN, RIGHT  . TIBIALIS TENDINITIS  . ABDOMINAL  PAIN, LOWER  . FASTING HYPERGLYCEMIA  . ACROMIOCLAVICULAR JOINT SEPARATION, RIGHT  . BREAST CANCER, HX OF  . ANEMIA, MILD, HX OF  . TOBACCO USE, QUIT  . DEGENERATIVE DISC DISEASE, LUMBAR SPINE  . Chest pain  . Family history of osteoporosis  . Post-menopausal   Past Medical History  Diagnosis Date  . Arthritis   . Cancer     breast  . Chicken pox   . Hypertension   . Measles   . Allergy     allergic rhinitis  . Hyperlipidemia   . Obesity   . Cataract     mild  . Lactose intolerance   . Hyperglycemia    Past Surgical History  Procedure Date  . Mastectomy   . Knee surgery   . Hand surgery     tendon   History  Substance Use Topics  . Smoking status: Former Smoker    Types: Cigarettes    Quit date: 05/14/2004  . Smokeless tobacco: Never Used  . Alcohol Use: Yes     occasional   Family History  Problem Relation  Age of Onset  . Heart disease Father     CHF  . Heart failure Father   . Hypertension Father   . Cancer Sister     pancreatic CA  . Heart disease Brother     MI       Review of Systems Review of Systems  Constitutional: Negative for fever, appetite change, and unexpected weight change. pos for fatigue Eyes: Negative for pain and visual disturbance.  Respiratory: Negative for cough and shortness of breath.   Cardiovascular: Negative for cp or palpitations    Gastrointestinal: Negative for nausea, diarrhea and constipation.  Genitourinary: Negative for urgency and frequency.  Skin: Negative for pallor or rash   Neurological: Negative for weakness, light-headedness, numbness and headaches.  Hematological: Negative for adenopathy. Does not bruise/bleed easily.  Psychiatric/Behavioral: Negative for dysphoric mood. The patient is not nervous/anxious.               Objective:   Physical Exam  Constitutional: She appears well-developed and well-nourished. No distress.       overwt and well appearing   HENT:  Head: Normocephalic and  atraumatic.  Right Ear: External ear normal.  Left Ear: External ear normal.  Nose: Nose normal.  Mouth/Throat: Oropharynx is clear and moist. No oropharyngeal exudate.  Eyes: Conjunctivae and EOM are normal. Pupils are equal, round, and reactive to light. No scleral icterus.  Neck: Normal range of motion. Neck supple. No JVD present. Carotid bruit is not present. No thyromegaly present.  Cardiovascular: Normal rate, regular rhythm, normal heart sounds and intact distal pulses.  Exam reveals no gallop.   Pulmonary/Chest: Effort normal and breath sounds normal. No respiratory distress. She has no wheezes.  Abdominal: Soft. Bowel sounds are normal. She exhibits no distension, no abdominal bruit and no mass. There is no tenderness.  Genitourinary: No breast swelling, tenderness, discharge or bleeding.       S/p R mastectomy- no M or tenderness at site L breast - no lump/ tenderness/skin change/ nipple d/c   Musculoskeletal: Normal range of motion. She exhibits no edema and no tenderness.       No kyphosis   Lymphadenopathy:    She has no cervical adenopathy.  Neurological: She is alert. She has normal reflexes. No cranial nerve deficit. She exhibits normal muscle tone. Coordination normal.  Skin: Skin is warm and dry. No rash noted. No erythema. No pallor.  Psychiatric: She has a normal mood and affect.          Assessment & Plan:

## 2011-06-12 NOTE — Assessment & Plan Note (Signed)
sched dexa Counseled on ca and D and exercise

## 2011-06-12 NOTE — Assessment & Plan Note (Signed)
Diet controlled Lab today Disc goals and also low sat fat diet

## 2011-06-12 NOTE — Assessment & Plan Note (Signed)
Discussed how this problem influences overall health and the risks it imposes  Reviewed plan for weight loss with lower calorie diet (via better food choices and also portion control or program like weight watchers) and exercise building up to or more than 30 minutes 5 days per week including some aerobic activity    

## 2011-06-12 NOTE — Assessment & Plan Note (Signed)
a1c today Has been well controlled with diet  Rev low glycemic diet

## 2011-06-12 NOTE — Assessment & Plan Note (Signed)
Cbc today,no symptoms colonosc utd

## 2011-06-12 NOTE — Assessment & Plan Note (Signed)
sched dexa

## 2011-06-15 ENCOUNTER — Telehealth: Payer: Self-pay

## 2011-06-15 DIAGNOSIS — Z800 Family history of malignant neoplasm of digestive organs: Secondary | ICD-10-CM

## 2011-06-15 NOTE — Telephone Encounter (Signed)
Michele Bryant (just diagnosed with panc cancer). His younger sister died of pancreatic cancer last year. His other sister was with him today and is interested in referral to genetic councilor at the cancer center. She has had remote breast cancer. Can you please set it up.  Michele Bryant 09-May-1946 Cell [phone]  Thank you

## 2011-06-15 NOTE — Telephone Encounter (Signed)
Pt referral sent to the [facility].

## 2011-06-19 ENCOUNTER — Ambulatory Visit
Admission: RE | Admit: 2011-06-19 | Discharge: 2011-06-19 | Disposition: A | Discharge status: Home / Self Care | Payer: Medicare Other | Source: Ambulatory Visit | Attending: Family Medicine | Admitting: Family Medicine

## 2011-06-19 DIAGNOSIS — Z780 Asymptomatic menopausal state: Secondary | ICD-10-CM

## 2011-06-19 DIAGNOSIS — Z13820 Encounter for screening for osteoporosis: Secondary | ICD-10-CM | POA: Diagnosis not present

## 2011-06-20 ENCOUNTER — Other Ambulatory Visit: Payer: Medicare Other

## 2011-06-21 ENCOUNTER — Telehealth: Payer: Self-pay | Admitting: Family Medicine

## 2011-06-21 NOTE — Telephone Encounter (Signed)
Spoke with pt see result note

## 2011-06-22 DIAGNOSIS — H26499 Other secondary cataract, unspecified eye: Secondary | ICD-10-CM | POA: Diagnosis not present

## 2011-06-28 ENCOUNTER — Encounter: Payer: Self-pay | Admitting: Family Medicine

## 2011-06-29 ENCOUNTER — Ambulatory Visit (INDEPENDENT_AMBULATORY_CARE_PROVIDER_SITE_OTHER): Payer: Medicare Other | Admitting: Family Medicine

## 2011-06-29 ENCOUNTER — Encounter: Payer: Self-pay | Admitting: Family Medicine

## 2011-06-29 VITALS — BP 144/76 | HR 80 | Temp 98.0°F | Ht 67.0 in | Wt 216.8 lb

## 2011-06-29 DIAGNOSIS — R7989 Other specified abnormal findings of blood chemistry: Secondary | ICD-10-CM | POA: Insufficient documentation

## 2011-06-29 DIAGNOSIS — E669 Obesity, unspecified: Secondary | ICD-10-CM | POA: Diagnosis not present

## 2011-06-29 DIAGNOSIS — Z800 Family history of malignant neoplasm of digestive organs: Secondary | ICD-10-CM | POA: Diagnosis not present

## 2011-06-29 DIAGNOSIS — R946 Abnormal results of thyroid function studies: Secondary | ICD-10-CM | POA: Diagnosis not present

## 2011-06-29 LAB — T3 UPTAKE: T3 Uptake: 29 % (ref 22.5–37.0)

## 2011-06-29 LAB — T4, FREE: Free T4: 0.79 ng/dL (ref 0.60–1.60)

## 2011-06-29 LAB — T3, FREE: T3, Free: 3.1 pg/mL (ref 2.3–4.2)

## 2011-06-29 NOTE — Patient Instructions (Addendum)
More thyroid labs today  Keep up the good work with diet and exercise  Will update with plan when they return

## 2011-06-29 NOTE — Progress Notes (Signed)
Subjective:    Patient ID: Michele Bryant, female    DOB: [birth year], 66 y.o.   MRN: [id number]  HPI Here for f/u of low tsh  No hx of thyroid problems  No change in neck or swelling or pain    Last dexa normal range- reassuring  Did loose 6 lb-- is really trying to loose weight  Is watching what she eats - healthy Also cutting portions  Also exercising 3 days per week and inc that   Helping  Mother who broke her hip   bp 144/76  bmi is 60   Brother was just dx with pancreatic cancer  Patient Active Problem List  Diagnoses  . HYPERLIPIDEMIA  . OBESITY  . GLAUCOMA  . HYPERTENSION  . CARDIOMYOPATHY  . ALLERGIC RHINITIS  . BACK PAIN, RIGHT  . TIBIALIS TENDINITIS  . ABDOMINAL PAIN, LOWER  . FASTING HYPERGLYCEMIA  . ACROMIOCLAVICULAR JOINT SEPARATION, RIGHT  . BREAST CANCER, HX OF  . ANEMIA, MILD, HX OF  . TOBACCO USE, QUIT  . DEGENERATIVE DISC DISEASE, LUMBAR SPINE  . Chest pain  . Family history of osteoporosis  . Post-menopausal  . Low TSH level  . Family history of pancreatic cancer   Past Medical History  Diagnosis Date  . Arthritis   . Cancer     breast  . Chicken pox   . Hypertension   . Measles   . Allergy     allergic rhinitis  . Hyperlipidemia   . Obesity   . Cataract     mild  . Lactose intolerance   . Hyperglycemia   . Allergic rhinitis    Past Surgical History  Procedure Date  . Mastectomy   . Knee surgery   . Hand surgery     tendon   History  Substance Use Topics  . Smoking status: Former Smoker    Types: Cigarettes    Quit date: 05/14/2004  . Smokeless tobacco: Never Used  . Alcohol Use: Yes     occasional   Family History  Problem Relation Age of Onset  . Heart disease Father     CHF  . Heart failure Father   . Hypertension Father   . Cancer Sister     pancreatic CA  . Heart disease Brother     MI  . Pancreatic cancer Brother    Allergies  Allergen Reactions  . Hydrocodone Itching   Current Outpatient  Prescriptions on File Prior to Visit  Medication Sig Dispense Refill  . acetaminophen (TYLENOL) 325 MG tablet Take 650 mg by mouth every 6 (six) hours as needed.        Marland Kitchen amLODipine (NORVASC) 5 MG tablet Take 1 tablet (5 mg total) by mouth daily.  30 tablet  11  . aspirin 81 MG EC tablet Take 81 mg by mouth daily.        . benazepril-hydrochlorthiazide (LOTENSIN HCT) 20-12.5 MG per tablet Take 2 tablets by mouth daily.  60 tablet  11  . brimonidine (ALPHAGAN P) 0.1 % SOLN Place 1 drop into both eyes every 8 (eight) hours.        . cholecalciferol (VITAMIN D) 1000 UNITS tablet Take 2,000-3,000 Units by mouth daily.      . Coenzyme Q10 (COQ10) 100 MG CAPS Take 1 tablet by mouth daily.      . Cyanocobalamin (VITAMIN B 12 PO) Take 1,000 mg by mouth daily.      . cyclobenzaprine (FLEXERIL) 10 MG tablet Take 1  tablet by mouth 3 (three) times daily as needed.       . Omega-3 Fatty Acids (OMEGA 3 PO) Take 1 tablet by mouth daily.      . traMADol-acetaminophen (ULTRACET) 37.5-325 MG per tablet 1 tablet by mouth every 4-6 hours as needed for pain.      Marland Kitchen travoprost, benzalkonium, (TRAVATAN) 0.004 % ophthalmic solution 1 drop at bedtime.            Review of Systems Review of Systems  Constitutional: Negative for fever, appetite change, fatigue and unexpected weight change. (no wt loss) Eyes: Negative for pain and visual disturbance.  Neck- no swelling or discomort Respiratory: Negative for cough and shortness of breath.   Cardiovascular: Negative for cp or palpitations   no tachycardia Gastrointestinal: Negative for nausea, diarrhea and constipation.  Genitourinary: Negative for urgency and frequency. no sweating  Skin: Negative for pallor or rash   Neurological: Negative for weakness, light-headedness, numbness and headaches. no tremor  Hematological: Negative for adenopathy. Does not bruise/bleed easily.  Psychiatric/Behavioral: Negative for dysphoric mood. The patient is not nervous/anxious.            Objective:   Physical Exam  Constitutional: She appears well-developed and well-nourished. No distress.  HENT:  Head: Normocephalic and atraumatic.  Mouth/Throat: Oropharynx is clear and moist.  Eyes: Conjunctivae and EOM are normal. Pupils are equal, round, and reactive to light. Right eye exhibits no discharge. Left eye exhibits no discharge. No scleral icterus.       No expopthalmos  Neck: Normal range of motion. Neck supple. No JVD present. No thyromegaly present.       Thyroid is prominent , not seemingly enlarged or tender or assymetric  Cardiovascular: Normal rate, regular rhythm, normal heart sounds and intact distal pulses.  Exam reveals no gallop.   No murmur heard. Pulmonary/Chest: Effort normal and breath sounds normal. No respiratory distress. She has no wheezes.  Abdominal: Bowel sounds are normal. She exhibits no distension. There is no tenderness.  Musculoskeletal: She exhibits no edema.  Lymphadenopathy:    She has no cervical adenopathy.  Neurological: She is alert. She has normal reflexes. She displays no tremor. No cranial nerve deficit. She exhibits normal muscle tone. Coordination normal.  Skin: Skin is warm and dry. No rash noted. No erythema. No pallor.       No hair loss   Psychiatric: She has a normal mood and affect.          Assessment & Plan:

## 2011-06-29 NOTE — Assessment & Plan Note (Signed)
Not symptomatic Thyroid profile today and update If abn- will ref to endo Nl exam today

## 2011-06-29 NOTE — Assessment & Plan Note (Signed)
Discussed how this problem influences overall health and the risks it imposes  Reviewed plan for weight loss with lower calorie diet (via better food choices and also portion control or program like weight watchers) and exercise building up to or more than 30 minutes 5 days per week including some aerobic activity   Urged to keep up the good effort

## 2011-07-01 DIAGNOSIS — Z800 Family history of malignant neoplasm of digestive organs: Secondary | ICD-10-CM | POA: Insufficient documentation

## 2011-07-01 NOTE — Assessment & Plan Note (Signed)
In her brother  Pt has no symptoms - will be on the look out, this was discussed

## 2011-08-07 DIAGNOSIS — H2510 Age-related nuclear cataract, unspecified eye: Secondary | ICD-10-CM | POA: Diagnosis not present

## 2011-08-16 ENCOUNTER — Other Ambulatory Visit: Payer: Self-pay | Admitting: Family Medicine

## 2011-08-20 DIAGNOSIS — H269 Unspecified cataract: Secondary | ICD-10-CM | POA: Diagnosis not present

## 2011-08-20 DIAGNOSIS — H2510 Age-related nuclear cataract, unspecified eye: Secondary | ICD-10-CM | POA: Diagnosis not present

## 2011-08-20 DIAGNOSIS — IMO0002 Reserved for concepts with insufficient information to code with codable children: Secondary | ICD-10-CM | POA: Diagnosis not present

## 2011-08-21 ENCOUNTER — Telehealth: Payer: Self-pay | Admitting: Genetic Counselor

## 2011-08-21 NOTE — Telephone Encounter (Signed)
lmonvm for pt re calling me for appt for genetics.

## 2011-09-25 DIAGNOSIS — H4011X0 Primary open-angle glaucoma, stage unspecified: Secondary | ICD-10-CM | POA: Diagnosis not present

## 2012-01-08 DIAGNOSIS — H409 Unspecified glaucoma: Secondary | ICD-10-CM | POA: Diagnosis not present

## 2012-01-08 DIAGNOSIS — H4011X0 Primary open-angle glaucoma, stage unspecified: Secondary | ICD-10-CM | POA: Diagnosis not present

## 2012-01-25 ENCOUNTER — Encounter: Payer: Self-pay | Admitting: Family Medicine

## 2012-01-25 ENCOUNTER — Ambulatory Visit (INDEPENDENT_AMBULATORY_CARE_PROVIDER_SITE_OTHER): Payer: Medicare Other | Admitting: Family Medicine

## 2012-01-25 VITALS — BP 148/77 | HR 78 | Temp 97.5°F | Ht 67.0 in

## 2012-01-25 DIAGNOSIS — R109 Unspecified abdominal pain: Secondary | ICD-10-CM | POA: Diagnosis not present

## 2012-01-25 DIAGNOSIS — Z800 Family history of malignant neoplasm of digestive organs: Secondary | ICD-10-CM | POA: Diagnosis not present

## 2012-01-25 DIAGNOSIS — Z23 Encounter for immunization: Secondary | ICD-10-CM | POA: Diagnosis not present

## 2012-01-25 LAB — POCT URINALYSIS DIPSTICK
Blood, UA: NEGATIVE
Glucose, UA: NEGATIVE
Nitrite, UA: NEGATIVE
Protein, UA: NEGATIVE
Urobilinogen, UA: 0.2
pH, UA: 5

## 2012-01-25 LAB — BASIC METABOLIC PANEL
BUN: 26 mg/dL — ABNORMAL HIGH (ref 6–23)
Chloride: 103 mEq/L (ref 96–112)
Glucose, Bld: 106 mg/dL — ABNORMAL HIGH (ref 70–99)
Potassium: 3.8 mEq/L (ref 3.5–5.1)
Sodium: 138 mEq/L (ref 135–145)

## 2012-01-25 LAB — CBC WITH DIFFERENTIAL/PLATELET
Basophils Absolute: 0.1 10*3/uL (ref 0.0–0.1)
Eosinophils Absolute: 0.3 10*3/uL (ref 0.0–0.7)
Lymphocytes Relative: 32.8 % (ref 12.0–46.0)
MCHC: 32.6 g/dL (ref 30.0–36.0)
Monocytes Absolute: 0.3 10*3/uL (ref 0.1–1.0)
Neutrophils Relative %: 53.9 % (ref 43.0–77.0)
Platelets: 180 10*3/uL (ref 150.0–400.0)
RBC: 3.63 Mil/uL — ABNORMAL LOW (ref 3.87–5.11)
RDW: 13.4 % (ref 11.5–14.6)

## 2012-01-25 LAB — HEPATIC FUNCTION PANEL
Bilirubin, Direct: 0 mg/dL (ref 0.0–0.3)
Total Bilirubin: 0.5 mg/dL (ref 0.3–1.2)

## 2012-01-25 LAB — LIPASE: Lipase: 21 U/L (ref 11.0–59.0)

## 2012-01-25 MED ORDER — RANITIDINE HCL 150 MG PO TABS: 150.0000 mg | ORAL_TABLET | Freq: Two times a day (BID) | ORAL | Status: DC

## 2012-01-25 NOTE — Assessment & Plan Note (Signed)
bilat LQ and LUQ- worse with eating and intermittent Sometimes burning Lots of stress Hx of panc ca in family- pt worried Lab today Trial of zanac 150 bid  Then update Consider CT if not imp or worse

## 2012-01-25 NOTE — Progress Notes (Signed)
Subjective:    Patient ID: Michele Bryant, female    DOB: 10-21-1945, 66 y.o.   MRN: [id number]  HPI Here for symptoms of abdominal pain  Mostly in lower abd and occ in L upper quadrant For several weeks   Bothered her mostly when she ate  Was worse earlier this week  Has several bms per day -- feels like she does not empty all the way   No blood in stool  One day had diarrhea due to food she ate - immodium   No n/v  Nothing otc   ua is clear - but ? A little dehydrated   Is not eating or drinking much due to fear of the pain  Has family hx of pancreatic cancer -- and worries most about that  (brother has it)  Some stress lately  Patient Active Problem List  Diagnosis  . HYPERLIPIDEMIA  . OBESITY  . GLAUCOMA  . HYPERTENSION  . CARDIOMYOPATHY  . ALLERGIC RHINITIS  . BACK PAIN, RIGHT  . TIBIALIS TENDINITIS  . ABDOMINAL PAIN, LOWER  . FASTING HYPERGLYCEMIA  . ACROMIOCLAVICULAR JOINT SEPARATION, RIGHT  . BREAST CANCER, HX OF  . ANEMIA, MILD, HX OF  . TOBACCO USE, QUIT  . DEGENERATIVE DISC DISEASE, LUMBAR SPINE  . Chest pain  . Family history of osteoporosis  . Post-menopausal  . Low TSH level  . Family history of pancreatic cancer   Past Medical History  Diagnosis Date  . Arthritis   . Cancer     breast  . Chicken pox   . Hypertension   . Measles   . Allergy     allergic rhinitis  . Hyperlipidemia   . Obesity   . Cataract     mild  . Lactose intolerance   . Hyperglycemia   . Allergic rhinitis    Past Surgical History  Procedure Date  . Mastectomy   . Knee surgery   . Hand surgery     tendon   History  Substance Use Topics  . Smoking status: Former Smoker    Types: Cigarettes    Quit date: 05/14/2004  . Smokeless tobacco: Never Used  . Alcohol Use: Yes     occasional   Family History  Problem Relation Age of Onset  . Heart disease Father     CHF  . Heart failure Father   . Hypertension Father   . Cancer Sister     pancreatic CA  .  Heart disease Brother     MI  . Pancreatic cancer Brother    Allergies  Allergen Reactions  . Hydrocodone Itching   Current Outpatient Prescriptions on File Prior to Visit  Medication Sig Dispense Refill  . amLODipine (NORVASC) 5 MG tablet Take 1 tablet (5 mg total) by mouth daily.  30 tablet  11  . aspirin 81 MG EC tablet Take 81 mg by mouth daily.        . benazepril-hydrochlorthiazide (LOTENSIN HCT) 20-12.5 MG per tablet Take 2 tablets by mouth daily.  60 tablet  11  . brimonidine (ALPHAGAN P) 0.1 % SOLN Place 1 drop into both eyes every 8 (eight) hours.        . cholecalciferol (VITAMIN D) 1000 UNITS tablet Take 2,000-3,000 Units by mouth daily.      . Coenzyme Q10 (COQ10) 100 MG CAPS Take 1 tablet by mouth daily.      . Cyanocobalamin (VITAMIN B 12 PO) Take 1,000 mg by mouth daily.      Marland Kitchen  travoprost, benzalkonium, (TRAVATAN) 0.004 % ophthalmic solution 1 drop at bedtime.        Marland Kitchen acetaminophen (TYLENOL) 325 MG tablet Take 650 mg by mouth every 6 (six) hours as needed.        . cyclobenzaprine (FLEXERIL) 10 MG tablet Take 1 tablet by mouth 3 (three) times daily as needed.       . Omega-3 Fatty Acids (OMEGA 3 PO) Take 1 tablet by mouth daily.      . traMADol-acetaminophen (ULTRACET) 37.5-325 MG per tablet 1 tablet by mouth every 4-6 hours as needed for pain.             Review of Systems Review of Systems  Constitutional: Negative for fever, appetite change, fatigue and unexpected weight change.  Eyes: Negative for pain and visual disturbance.  Respiratory: Negative for cough and shortness of breath.   Cardiovascular: Negative for cp or palpitations    Gastrointestinal: Negative for nausea, diarrhea and constipation. pos for abdominal pain , neg for vomiting ,neg for blood in stool or dark stools Genitourinary: Negative for urgency and frequency.  Skin: Negative for pallor or rash   Neurological: Negative for weakness, light-headedness, numbness and headaches.  Hematological:  Negative for adenopathy. Does not bruise/bleed easily.  Psychiatric/Behavioral: Negative for dysphoric mood. The patient is not nervous/anxious.         Objective:   Physical Exam  Constitutional: She appears well-developed and well-nourished. No distress.  HENT:  Head: Normocephalic and atraumatic.  Mouth/Throat: Oropharynx is clear and moist.  Eyes: Conjunctivae normal and EOM are normal. Pupils are equal, round, and reactive to light. Right eye exhibits no discharge. Left eye exhibits no discharge. No scleral icterus.  Neck: Normal range of motion. Neck supple.  Cardiovascular: Normal rate, regular rhythm, normal heart sounds and intact distal pulses.  Exam reveals no gallop.   Pulmonary/Chest: Effort normal and breath sounds normal. No respiratory distress. She has no wheezes.  Abdominal: Soft. Bowel sounds are normal. She exhibits no distension and no mass. There is tenderness. There is no rebound and no guarding.       Tender mildly over LUQ , no tenderness in LQs at this time No rebound or guarding  Musculoskeletal: She exhibits no edema.  Lymphadenopathy:    She has no cervical adenopathy.  Neurological: She is alert. She has normal reflexes.  Skin: Skin is warm and dry. No rash noted. No erythema. No pallor.  Psychiatric: She has a normal mood and affect.          Assessment & Plan:

## 2012-01-25 NOTE — Assessment & Plan Note (Signed)
Pt is worried about this with her abd pain If not imp would consider CT scan

## 2012-01-25 NOTE — Patient Instructions (Addendum)
Labs today  Eat a bland diet for now  Get in more fluids Try the zantac as directed  Lets see how you do over the weekend -then make a plan  If symptoms get severe- go to ER

## 2012-01-29 ENCOUNTER — Encounter: Payer: Self-pay | Admitting: *Deleted

## 2012-03-26 ENCOUNTER — Other Ambulatory Visit: Payer: Self-pay | Admitting: Family Medicine

## 2012-03-26 DIAGNOSIS — Z1231 Encounter for screening mammogram for malignant neoplasm of breast: Secondary | ICD-10-CM

## 2012-04-22 DIAGNOSIS — Z9010 Acquired absence of unspecified breast and nipple: Secondary | ICD-10-CM | POA: Diagnosis not present

## 2012-04-22 DIAGNOSIS — Z853 Personal history of malignant neoplasm of breast: Secondary | ICD-10-CM | POA: Diagnosis not present

## 2012-05-01 ENCOUNTER — Ambulatory Visit: Payer: Medicare Other

## 2012-05-12 DIAGNOSIS — H4011X0 Primary open-angle glaucoma, stage unspecified: Secondary | ICD-10-CM | POA: Diagnosis not present

## 2012-05-12 DIAGNOSIS — H409 Unspecified glaucoma: Secondary | ICD-10-CM | POA: Diagnosis not present

## 2012-06-09 ENCOUNTER — Ambulatory Visit
Admission: RE | Admit: 2012-06-09 | Discharge: 2012-06-09 | Disposition: A | Discharge status: Home / Self Care | Payer: Medicare Other | Source: Ambulatory Visit | Attending: Family Medicine | Admitting: Family Medicine

## 2012-06-09 DIAGNOSIS — Z1231 Encounter for screening mammogram for malignant neoplasm of breast: Secondary | ICD-10-CM | POA: Diagnosis not present

## 2012-06-11 ENCOUNTER — Encounter: Payer: Self-pay | Admitting: *Deleted

## 2012-06-16 ENCOUNTER — Other Ambulatory Visit: Payer: Self-pay | Admitting: Family Medicine

## 2012-08-14 DIAGNOSIS — H4011X0 Primary open-angle glaucoma, stage unspecified: Secondary | ICD-10-CM | POA: Diagnosis not present

## 2012-08-14 DIAGNOSIS — H409 Unspecified glaucoma: Secondary | ICD-10-CM | POA: Diagnosis not present

## 2013-01-26 DIAGNOSIS — H409 Unspecified glaucoma: Secondary | ICD-10-CM | POA: Diagnosis not present

## 2013-01-26 DIAGNOSIS — H4011X0 Primary open-angle glaucoma, stage unspecified: Secondary | ICD-10-CM | POA: Diagnosis not present

## 2013-02-02 DIAGNOSIS — H26499 Other secondary cataract, unspecified eye: Secondary | ICD-10-CM | POA: Diagnosis not present

## 2013-02-09 DIAGNOSIS — H26499 Other secondary cataract, unspecified eye: Secondary | ICD-10-CM | POA: Diagnosis not present

## 2013-02-26 ENCOUNTER — Other Ambulatory Visit: Payer: Self-pay | Admitting: Family Medicine

## 2013-02-26 NOTE — Telephone Encounter (Signed)
Fax refill request, no recent/future appt., please advise  

## 2013-02-26 NOTE — Telephone Encounter (Signed)
Please schedule winter PE or f/u and refill until then 

## 2013-02-27 MED ORDER — BENAZEPRIL-HYDROCHLOROTHIAZIDE 20-12.5 MG PO TABS: ORAL_TABLET | ORAL | Status: DC

## 2013-02-27 MED ORDER — RANITIDINE HCL 150 MG PO TABS: ORAL_TABLET | ORAL | Status: DC

## 2013-02-27 MED ORDER — AMLODIPINE BESYLATE 5 MG PO TABS: ORAL_TABLET | ORAL | Status: DC

## 2013-02-27 NOTE — Telephone Encounter (Signed)
Rx sent to pharmacy and CPE scheduled

## 2013-03-04 MED ORDER — BENAZEPRIL-HYDROCHLOROTHIAZIDE 20-12.5 MG PO TABS: ORAL_TABLET | ORAL | Status: DC

## 2013-03-04 MED ORDER — RANITIDINE HCL 150 MG PO TABS: ORAL_TABLET | ORAL | Status: DC

## 2013-03-04 NOTE — Telephone Encounter (Signed)
Re sent Rx to mail order pharmacy because they didn't receive 1st refill

## 2013-03-04 NOTE — Addendum Note (Signed)
Addended by: Shon Millet on: 03/04/2013 09:05 AM   Modules accepted: Orders

## 2013-04-07 ENCOUNTER — Ambulatory Visit (INDEPENDENT_AMBULATORY_CARE_PROVIDER_SITE_OTHER): Payer: Medicare Other | Admitting: Family Medicine

## 2013-04-07 ENCOUNTER — Encounter: Payer: Self-pay | Admitting: Family Medicine

## 2013-04-07 VITALS — BP 118/74 | HR 77 | Temp 97.8°F | Ht 67.0 in | Wt 216.0 lb

## 2013-04-07 DIAGNOSIS — K219 Gastro-esophageal reflux disease without esophagitis: Secondary | ICD-10-CM

## 2013-04-07 DIAGNOSIS — Z23 Encounter for immunization: Secondary | ICD-10-CM

## 2013-04-07 MED ORDER — OMEPRAZOLE 20 MG PO CPDR: 20.0000 mg | DELAYED_RELEASE_CAPSULE | Freq: Every day | ORAL | Status: DC

## 2013-04-07 NOTE — Progress Notes (Signed)
Subjective:    Patient ID: Michele Bryant, female    DOB: December 18, 1945, 67 y.o.   MRN: [id number]  HPI Here with some acid reflux issues  Used to get it occ - would take ranididine   Now she is having more consistent heartburn - since last Monday  Taking the zantac daily  Today and yesterday a bit better  Heartburn sensation and throat is burning also  Now st - ? From this or allergies No n/v  Uncomfortable is lying on L side  Symptoms are worse in the am  Some cough/ hoarseness - and feels like there is something in throat   Ate out before this started , and she had been eating lots of grape tomatoes  Also a few pc of pepperoni - the sat before    Flu shot today  Patient Active Problem List   Diagnosis Date Noted  . Abdominal pain 01/25/2012  . Family history of pancreatic cancer 07/01/2011  . Low TSH level 06/29/2011  . Family history of osteoporosis 06/12/2011  . Post-menopausal 06/12/2011  . Chest pain 09/07/2010  . 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 BACK PAIN, RIGHT 04/14/2010  . ABDOMINAL PAIN, LOWER 04/14/2010  . TIBIALIS TENDINITIS 06/30/2009  . ACROMIOCLAVICULAR JOINT SEPARATION, RIGHT 06/30/2009  . FASTING HYPERGLYCEMIA 08/30/2008  . OBESITY 05/18/2008  . ANEMIA, MILD, HX OF 10/07/2007  . HYPERLIPIDEMIA 09/03/2007  . TOBACCO USE, QUIT 09/03/2007  . GLAUCOMA 11/26/2006  . HYPERTENSION 11/26/2006  . CARDIOMYOPATHY 11/26/2006  . ALLERGIC RHINITIS 11/26/2006  . BREAST CANCER, HX OF 11/26/2006   Past Medical History  Diagnosis Date  . Arthritis   . Cancer     breast  . Chicken pox   . Hypertension   . Measles   . Allergy     allergic rhinitis  . Hyperlipidemia   . Obesity   . Cataract     mild  . Lactose intolerance   . Hyperglycemia   . Allergic rhinitis    Past Surgical History  Procedure Laterality Date  . Mastectomy    . Knee surgery    . Hand surgery      tendon   History  Substance Use Topics  . Smoking status:  Former Smoker    Types: Cigarettes    Quit date: 05/14/2004  . Smokeless tobacco: Never Used  . Alcohol Use: Yes     Comment: occasional   Family History  Problem Relation Age of Onset  . Heart disease Father     CHF  . Heart failure Father   . Hypertension Father   . Cancer Sister     pancreatic CA  . Heart disease Brother     MI  . Pancreatic cancer Brother    Allergies  Allergen Reactions  . Hydrocodone Itching   Current Outpatient Prescriptions on File Prior to Visit  Medication Sig Dispense Refill  . acetaminophen (TYLENOL) 325 MG tablet Take 650 mg by mouth every 6 (six) hours as needed.        Marland Kitchen amLODipine (NORVASC) 5 MG tablet TAKE 1 TABLET BY MOUTH DAILY.  90 tablet  1  . aspirin 81 MG EC tablet Take 81 mg by mouth daily.        . benazepril-hydrochlorthiazide (LOTENSIN HCT) 20-12.5 MG per tablet TAKE 2 TABLETS BY MOUTH DAILY.  180 tablet  1  . brimonidine (ALPHAGAN P) 0.1 % SOLN Place 1 drop into both eyes every 8 (eight) hours.        Marland Kitchen  cholecalciferol (VITAMIN D) 1000 UNITS tablet Take 2,000-3,000 Units by mouth daily.      . Coenzyme Q10 (COQ10) 100 MG CAPS Take 1 tablet by mouth daily.      . Cyanocobalamin (VITAMIN B 12 PO) Take 1,000 mg by mouth daily.      . Omega-3 Fatty Acids (OMEGA 3 PO) Take 1 tablet by mouth daily.      . ranitidine (ZANTAC) 150 MG tablet TAKE 1 TABLET (150 MG TOTAL) BY MOUTH 2 (TWO) TIMES DAILY.  180 tablet  1  . travoprost, benzalkonium, (TRAVATAN) 0.004 % ophthalmic solution 1 drop at bedtime.         No current facility-administered medications on file prior to visit.    Review of Systems Review of Systems  Constitutional: Negative for fever, appetite change, fatigue and unexpected weight change.  Eyes: Negative for pain and visual disturbance.  Respiratory: Negative for cough and shortness of breath.   Cardiovascular: Negative for cp or palpitations    Gastrointestinal: Negative for nausea, diarrhea and constipation. neg for  blood in stool or dark stool or abd pain (pos for strong family hx of pancreatic cancer)  Genitourinary: Negative for urgency and frequency.  Skin: Negative for pallor or rash   Neurological: Negative for weakness, light-headedness, numbness and headaches.  Hematological: Negative for adenopathy. Does not bruise/bleed easily.  Psychiatric/Behavioral: Negative for dysphoric mood. The patient is not nervous/anxious.         Objective:   Physical Exam  Constitutional: She appears well-developed and well-nourished. No distress.  obese and well appearing   HENT:  Head: Normocephalic and atraumatic.  Mouth/Throat: Oropharynx is clear and moist.  Eyes: Conjunctivae and EOM are normal. Pupils are equal, round, and reactive to light. No scleral icterus.  Neck: Normal range of motion. Neck supple.  Cardiovascular: Normal rate, regular rhythm, normal heart sounds and intact distal pulses.  Exam reveals no gallop.   Pulmonary/Chest: Effort normal and breath sounds normal. No respiratory distress. She has no wheezes. She has no rales.  Abdominal: Soft. Bowel sounds are normal. She exhibits no distension and no mass. There is no tenderness. There is no rebound and no guarding.  Lymphadenopathy:    She has no cervical adenopathy.  Neurological: She is alert. She has normal reflexes.  Skin: Skin is warm and dry. No rash noted. No erythema. No pallor.  Psychiatric: She has a normal mood and affect.          Assessment & Plan:

## 2013-04-07 NOTE — Patient Instructions (Signed)
See handouts about reflux Watch diet Work on weight loss  Raise head of bed with a brick if needed  Start omeprazole 20 mg each am  If needed -you can still take a zantac at night  Update if not starting to improve in a week or if worsening   Follow up with me in about 4-6 weeks

## 2013-04-07 NOTE — Progress Notes (Signed)
Pre-visit discussion using our clinic review tool. No additional management support is needed unless otherwise documented below in the visit note.  

## 2013-04-08 NOTE — Assessment & Plan Note (Signed)
Worse lately- failing full dose H2 tx with zantac  Will tx with omeprazole Disc diet/ habits/ elevating head of bed  Handouts given on diet and GERD F/u 4-6 wk or earlier if worse/no imp

## 2013-05-04 ENCOUNTER — Ambulatory Visit (INDEPENDENT_AMBULATORY_CARE_PROVIDER_SITE_OTHER): Payer: Medicare Other | Admitting: Family Medicine

## 2013-05-04 ENCOUNTER — Encounter: Payer: Self-pay | Admitting: Family Medicine

## 2013-05-04 VITALS — BP 150/78 | HR 78 | Temp 97.5°F | Wt 213.5 lb

## 2013-05-04 DIAGNOSIS — L293 Anogenital pruritus, unspecified: Secondary | ICD-10-CM

## 2013-05-04 DIAGNOSIS — N898 Other specified noninflammatory disorders of vagina: Secondary | ICD-10-CM

## 2013-05-04 MED ORDER — FLUCONAZOLE 150 MG PO TABS: 150.0000 mg | ORAL_TABLET | Freq: Once | ORAL | Status: DC

## 2013-05-04 NOTE — Progress Notes (Signed)
Pre-visit discussion using our clinic review tool. No additional management support is needed unless otherwise documented below in the visit note.  Not recently on abx.  Faint odor and itching in vagina noted for about 1 week. Used vinegar/water douche.  No discharge.  H/o of yeast infection, similar sx.  No FCNAV, no dysuria. No diarrhea.    Meds, vitals, and allergies reviewed.   ROS: See HPI.  Otherwise, noncontributory.  nad ncat Mmm Pelvic exam deferred.   Wet prep without clue or yeast noted.

## 2013-05-04 NOTE — Patient Instructions (Signed)
Use the diflucan if you don't improve.  Take care.

## 2013-05-05 DIAGNOSIS — N898 Other specified noninflammatory disorders of vagina: Secondary | ICD-10-CM | POA: Insufficient documentation

## 2013-05-05 NOTE — Assessment & Plan Note (Signed)
Likely yeast, but not seen on WP.  Would hold diflucan for now and use if not resolving.  She agrees.  F/u prn. No clue cells seen.

## 2013-05-25 ENCOUNTER — Encounter: Payer: Self-pay | Admitting: Family Medicine

## 2013-05-25 ENCOUNTER — Ambulatory Visit (INDEPENDENT_AMBULATORY_CARE_PROVIDER_SITE_OTHER): Payer: Medicare Other | Admitting: Family Medicine

## 2013-05-25 VITALS — BP 138/78 | HR 80 | Temp 98.0°F | Ht 66.25 in | Wt 213.5 lb

## 2013-05-25 DIAGNOSIS — E785 Hyperlipidemia, unspecified: Secondary | ICD-10-CM | POA: Diagnosis not present

## 2013-05-25 DIAGNOSIS — E669 Obesity, unspecified: Secondary | ICD-10-CM

## 2013-05-25 DIAGNOSIS — Z0000 Encounter for general adult medical examination without abnormal findings: Secondary | ICD-10-CM | POA: Diagnosis not present

## 2013-05-25 DIAGNOSIS — I10 Essential (primary) hypertension: Secondary | ICD-10-CM | POA: Diagnosis not present

## 2013-05-25 DIAGNOSIS — Z853 Personal history of malignant neoplasm of breast: Secondary | ICD-10-CM | POA: Diagnosis not present

## 2013-05-25 DIAGNOSIS — R7309 Other abnormal glucose: Secondary | ICD-10-CM | POA: Diagnosis not present

## 2013-05-25 LAB — CBC WITH DIFFERENTIAL/PLATELET
BASOS PCT: 1 % (ref 0.0–3.0)
Basophils Absolute: 0 10*3/uL (ref 0.0–0.1)
EOS ABS: 0.2 10*3/uL (ref 0.0–0.7)
Eosinophils Relative: 4.3 % (ref 0.0–5.0)
HCT: 37 % (ref 36.0–46.0)
Hemoglobin: 12.4 g/dL (ref 12.0–15.0)
Lymphocytes Relative: 35.6 % (ref 12.0–46.0)
Lymphs Abs: 1.7 10*3/uL (ref 0.7–4.0)
MCHC: 33.3 g/dL (ref 30.0–36.0)
MCV: 99.5 fl (ref 78.0–100.0)
Monocytes Absolute: 0.3 10*3/uL (ref 0.1–1.0)
Monocytes Relative: 6.3 % (ref 3.0–12.0)
NEUTROS ABS: 2.5 10*3/uL (ref 1.4–7.7)
Neutrophils Relative %: 52.8 % (ref 43.0–77.0)
Platelets: 291 10*3/uL (ref 150.0–400.0)
RBC: 3.72 Mil/uL — AB (ref 3.87–5.11)
RDW: 13.6 % (ref 11.5–14.6)
WBC: 4.8 10*3/uL (ref 4.5–10.5)

## 2013-05-25 LAB — LIPID PANEL
CHOL/HDL RATIO: 3
Cholesterol: 192 mg/dL (ref 0–200)
HDL: 63.5 mg/dL (ref >39.00)
LDL CALC: 104 mg/dL — AB (ref 0–99)
Triglycerides: 124 mg/dL (ref 0.0–149.0)
VLDL: 24.8 mg/dL (ref 0.0–40.0)

## 2013-05-25 LAB — COMPREHENSIVE METABOLIC PANEL
ALT: 17 U/L (ref 0–35)
AST: 22 U/L (ref 0–37)
Albumin: 4 g/dL (ref 3.5–5.2)
Alkaline Phosphatase: 60 U/L (ref 39–117)
BILIRUBIN TOTAL: 0.6 mg/dL (ref 0.3–1.2)
BUN: 20 mg/dL (ref 6–23)
CO2: 27 meq/L (ref 19–32)
Calcium: 9.5 mg/dL (ref 8.4–10.5)
Chloride: 103 mEq/L (ref 96–112)
Creatinine, Ser: 1.1 mg/dL (ref 0.4–1.2)
GFR: 65.32 mL/min (ref >60.00)
Glucose, Bld: 102 mg/dL — ABNORMAL HIGH (ref 70–99)
Potassium: 3.7 mEq/L (ref 3.5–5.1)
SODIUM: 140 meq/L (ref 135–145)
TOTAL PROTEIN: 7.2 g/dL (ref 6.0–8.3)

## 2013-05-25 LAB — TSH: TSH: 0.55 u[IU]/mL (ref 0.35–5.50)

## 2013-05-25 LAB — HEMOGLOBIN A1C: HEMOGLOBIN A1C: 5 % (ref 4.6–6.5)

## 2013-05-25 MED ORDER — OMEPRAZOLE 20 MG PO CPDR: 20.0000 mg | DELAYED_RELEASE_CAPSULE | Freq: Every day | ORAL | Status: DC

## 2013-05-25 NOTE — Assessment & Plan Note (Signed)
Discussed how this problem influences overall health and the risks it imposes  Reviewed plan for weight loss with lower calorie diet (via better food choices and also portion control or program like weight watchers) and exercise building up to or more than 30 minutes 5 days per week including some aerobic activity    

## 2013-05-25 NOTE — Assessment & Plan Note (Signed)
Reviewed health habits including diet and exercise and skin cancer prevention Reviewed appropriate screening tests for age  Also reviewed health mt list, fam hx and immunization status , as well as social and family history   See HPI Lab today 

## 2013-05-25 NOTE — Assessment & Plan Note (Signed)
Disc goals for lipids and reasons to control them Rev labs with pt from last draw Rev low sat fat diet in detail  Lab today 

## 2013-05-25 NOTE — Assessment & Plan Note (Signed)
Doing well with follow up and up to date with mammograms

## 2013-05-25 NOTE — Assessment & Plan Note (Signed)
A1C today Disc low glycemic diet  

## 2013-05-25 NOTE — Assessment & Plan Note (Signed)
BP: 138/78 mmHg  bp in fair control at this time  No changes needed Disc lifstyle change with low sodium diet and exercise   Labs today

## 2013-05-25 NOTE — Progress Notes (Signed)
Subjective:    Patient ID: Michele Bryant, female    DOB: 1946/03/18, 68 y.o.   MRN: [id number]  HPI I have personally reviewed the Medicare Annual Wellness questionnaire and have noted 1. The patient's medical and social history 2. Their use of alcohol, tobacco or illicit drugs 3. Their current medications and supplements 4. The patient's functional ability including ADL's, fall risks, home safety risks and hearing or visual             impairment. 5. Diet and physical activities 6. Evidence for depression or mood disorders  The patients weight, height, BMI have been recorded in the chart and visual acuity is per eye clinic.  I have made referrals, counseling and provided education to the patient based review of the above and I have provided the pt with a written personalized care plan for preventive services.  Feeling good overall    See scanned forms.  Routine anticipatory guidance given to patient.  See health maintenance. Flu vaccine 11/14 Shingles - has not had the vaccine ? If interested in one , has had shingles in the past  PNA 2/11  Tetanus 1/08 vaccine  Colonoscopy 7/05 - will be due in summer for 10 year  Breast cancer screening- mammogram 1/14 was neg - due this mo and she will make her own appt  Hx of breast ca with mastectomy-no meds currently  Self exam nl  Advance directive- does not have a living will - is interested in doing that  Cognitive function addressed- see scanned forms- and if abnormal then additional documentation follows. -no problems   Due for her labs today   PMH and SH reviewed  Meds, vitals, and allergies reviewed.   ROS: See HPI.  Otherwise negative.    Had dexa nl in 2013- reassuring  She takes vit D - not calcium   Pap nl 2/12  No gyn  No symptoms or new partners   Wt is stable with bmi of 34 She exercises regularly  Diet is much better (at holidays she got off kilter)   bp is stable today  No cp or palpitations or headaches  or edema  No side effects to medicines  BP Readings from Last 3 Encounters:  05/25/13 138/78  05/04/13 150/78  04/07/13 118/74       Hyperlipidemia Lab Results  Component Value Date   CHOL 170 06/12/2011   CHOL 196 06/26/2010   CHOL 191 12/16/2009   Lab Results  Component Value Date   HDL 65.60 06/12/2011   HDL 71.40 06/26/2010   HDL 64.20 12/16/2009   Lab Results  Component Value Date   LDLCALC 86 06/12/2011   LDLCALC 111* 06/26/2010   LDLCALC 110* 12/16/2009   Lab Results  Component Value Date   TRIG 92.0 06/12/2011   TRIG 69.0 06/26/2010   TRIG 85.0 12/16/2009   Lab Results  Component Value Date   CHOLHDL 3 06/12/2011   CHOLHDL 3 06/26/2010   CHOLHDL 3 12/16/2009   Lab Results  Component Value Date   LDLDIRECT 91.1 06/13/2006   LDLDIRECT DEL 05/29/2006    Due for labs   Patient Active Problem List   Diagnosis Date Noted  . Encounter for Medicare annual wellness exam 05/25/2013  . Vaginal itching 05/05/2013  . GERD (gastroesophageal reflux disease) 04/07/2013  . Abdominal pain 01/25/2012  . Family history of pancreatic cancer 07/01/2011  . Low TSH level 06/29/2011  . Family history of osteoporosis 06/12/2011  . Post-menopausal 06/12/2011  .  Chest pain 09/07/2010  . 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 BACK PAIN, RIGHT 04/14/2010  . ABDOMINAL PAIN, LOWER 04/14/2010  . TIBIALIS TENDINITIS 06/30/2009  . ACROMIOCLAVICULAR JOINT SEPARATION, RIGHT 06/30/2009  . FASTING HYPERGLYCEMIA 08/30/2008  . OBESITY 05/18/2008  . ANEMIA, MILD, HX OF 10/07/2007  . HYPERLIPIDEMIA 09/03/2007  . TOBACCO USE, QUIT 09/03/2007  . GLAUCOMA 11/26/2006  . HYPERTENSION 11/26/2006  . CARDIOMYOPATHY 11/26/2006  . ALLERGIC RHINITIS 11/26/2006  . BREAST CANCER, HX OF 11/26/2006   Past Medical History  Diagnosis Date  . Arthritis   . Cancer     breast  . Chicken pox   . Hypertension   . Measles   . Allergy     allergic rhinitis  . Hyperlipidemia   . Obesity   .  Cataract     mild  . Lactose intolerance   . Hyperglycemia   . Allergic rhinitis    Past Surgical History  Procedure Laterality Date  . Mastectomy    . Knee surgery    . Hand surgery      tendon   History  Substance Use Topics  . Smoking status: Former Smoker    Types: Cigarettes    Quit date: 05/14/2004  . Smokeless tobacco: Never Used  . Alcohol Use: Yes     Comment: occasional   Family History  Problem Relation Age of Onset  . Heart disease Father     CHF  . Heart failure Father   . Hypertension Father   . Cancer Sister     pancreatic CA  . Heart disease Brother     MI  . Pancreatic cancer Brother    Allergies  Allergen Reactions  . Hydrocodone Itching   Current Outpatient Prescriptions on File Prior to Visit  Medication Sig Dispense Refill  . amLODipine (NORVASC) 5 MG tablet TAKE 1 TABLET BY MOUTH DAILY.  90 tablet  1  . aspirin 81 MG EC tablet Take 81 mg by mouth daily.        . benazepril-hydrochlorthiazide (LOTENSIN HCT) 20-12.5 MG per tablet TAKE 2 TABLETS BY MOUTH DAILY.  180 tablet  1  . brimonidine (ALPHAGAN P) 0.1 % SOLN Place 1 drop into both eyes every 8 (eight) hours.        . cholecalciferol (VITAMIN D) 1000 UNITS tablet Take 2,000-3,000 Units by mouth daily.      . Coenzyme Q10 (COQ10) 100 MG CAPS Take 1 tablet by mouth daily.      Marland Kitchen omeprazole (PRILOSEC) 20 MG capsule Take 1 capsule (20 mg total) by mouth daily.  30 capsule  3  . travoprost, benzalkonium, (TRAVATAN) 0.004 % ophthalmic solution 1 drop at bedtime.         No current facility-administered medications on file prior to visit.    Review of Systems Review of Systems  Constitutional: Negative for fever, appetite change, fatigue and unexpected weight change.  Eyes: Negative for pain and visual disturbance.  Respiratory: Negative for cough and shortness of breath.   Cardiovascular: Negative for cp or palpitations    Gastrointestinal: Negative for nausea, diarrhea and constipation.    Genitourinary: Negative for urgency and frequency.  Skin: Negative for pallor or rash   Neurological: Negative for weakness, light-headedness, numbness and headaches.  Hematological: Negative for adenopathy. Does not bruise/bleed easily.  Psychiatric/Behavioral: Negative for dysphoric mood. The patient is not nervous/anxious.         Objective:   Physical Exam  Constitutional: She appears well-developed and  well-nourished. No distress.  obese and well appearing   HENT:  Head: Normocephalic and atraumatic.  Right Ear: External ear normal.  Left Ear: External ear normal.  Mouth/Throat: Oropharynx is clear and moist.  Eyes: Conjunctivae and EOM are normal. Pupils are equal, round, and reactive to light. No scleral icterus.  Neck: Normal range of motion. Neck supple. No JVD present. Carotid bruit is not present. No thyromegaly present.  Cardiovascular: Normal rate, regular rhythm, normal heart sounds and intact distal pulses.  Exam reveals no gallop.   Pulmonary/Chest: Effort normal and breath sounds normal. No respiratory distress. She has no wheezes. She exhibits no tenderness.  Abdominal: Soft. Bowel sounds are normal. She exhibits no distension, no abdominal bruit and no mass. There is no tenderness.  Genitourinary: No breast swelling, tenderness, discharge or bleeding.  S/p R mastectomy- site is clear  Breast exam: No mass, nodules, thickening, tenderness, bulging, retraction, inflamation, nipple discharge or skin changes noted.  No axillary or clavicular LA.    Musculoskeletal: Normal range of motion. She exhibits no edema and no tenderness.  Lymphadenopathy:    She has no cervical adenopathy.  Neurological: She is alert. She has normal reflexes. No cranial nerve deficit. She exhibits normal muscle tone. Coordination normal.  Skin: Skin is warm and dry. No rash noted. No erythema. No pallor.  Psychiatric: She has a normal mood and affect.          Assessment & Plan:

## 2013-05-25 NOTE — Progress Notes (Signed)
Pre-visit discussion using our clinic review tool. No additional management support is needed unless otherwise documented below in the visit note.  

## 2013-05-25 NOTE — Patient Instructions (Signed)
If you are interested in a shingles/zoster vaccine - call your insurance to check on coverage,( you should not get it within 1 month of other vaccines) , then call us for a prescription  for it to take to a pharmacy that gives the shot , or make a nurse visit to get it here depending on your coverage I think you will be due for a screening colonoscopy in July- call us to schedule if you do not get a card in the mail Don't forget to make your annual mammogram appointment  Try to get 1200-1500 mg of calcium per day with at least 1000 iu of vitamin D - for bone health  Labs today

## 2013-05-26 ENCOUNTER — Encounter: Payer: Self-pay | Admitting: *Deleted

## 2013-05-27 ENCOUNTER — Other Ambulatory Visit: Payer: Self-pay

## 2013-05-27 DIAGNOSIS — Z9011 Acquired absence of right breast and nipple: Secondary | ICD-10-CM

## 2013-05-27 DIAGNOSIS — Z1231 Encounter for screening mammogram for malignant neoplasm of breast: Secondary | ICD-10-CM

## 2013-05-28 DIAGNOSIS — H4011X0 Primary open-angle glaucoma, stage unspecified: Secondary | ICD-10-CM | POA: Diagnosis not present

## 2013-05-28 DIAGNOSIS — H409 Unspecified glaucoma: Secondary | ICD-10-CM | POA: Diagnosis not present

## 2013-06-17 ENCOUNTER — Telehealth: Payer: Self-pay | Admitting: Family Medicine

## 2013-06-17 NOTE — Telephone Encounter (Signed)
Relevant patient education assigned to patient using Emmi. ° °

## 2013-06-18 ENCOUNTER — Other Ambulatory Visit: Payer: Self-pay

## 2013-06-18 ENCOUNTER — Ambulatory Visit
Admission: RE | Admit: 2013-06-18 | Discharge: 2013-06-18 | Disposition: A | Discharge status: Home / Self Care | Payer: Medicare Other | Source: Ambulatory Visit

## 2013-06-18 ENCOUNTER — Telehealth: Payer: Self-pay | Admitting: Family Medicine

## 2013-06-18 DIAGNOSIS — Z9011 Acquired absence of right breast and nipple: Secondary | ICD-10-CM

## 2013-06-18 DIAGNOSIS — Z1231 Encounter for screening mammogram for malignant neoplasm of breast: Secondary | ICD-10-CM | POA: Diagnosis not present

## 2013-06-18 NOTE — Telephone Encounter (Signed)
Patient Information:  Caller Name: Toy  Phone: [phone]  Patient: Michele Bryant  Gender: Female  DOB: Apr 26, 1946  Age: 68 Years  PCP: [facility], [facility])  Office Follow Up:  Does the office need to follow up with this patient?: No  Instructions For The Office: N/A   Symptoms  Reason For Call & Symptoms: Pt states she has back pain, lower back.  Reviewed Health History In EMR: Yes  Reviewed Medications In EMR: Yes  Reviewed Allergies In EMR: Yes  Reviewed Surgeries / Procedures: Yes  Date of Onset of Symptoms: 06/11/2013  Guideline(s) Used:  Back Pain  Disposition Per Guideline:   See Today in Office  Reason For Disposition Reached:   Tingling or numbness in the legs or feet  Advice Given:  Call Back If:  You become worse.  Patient Will Follow Care Advice:  YES  Appointment Scheduled:  06/19/2013 08:15:00 Appointment Scheduled Provider:  Loura Pardon [facility])

## 2013-06-18 NOTE — Telephone Encounter (Signed)
Will see her at appt

## 2013-06-19 ENCOUNTER — Ambulatory Visit (INDEPENDENT_AMBULATORY_CARE_PROVIDER_SITE_OTHER)
Admission: RE | Admit: 2013-06-19 | Discharge: 2013-06-19 | Disposition: A | Discharge status: Home / Self Care | Payer: Medicare Other | Source: Ambulatory Visit | Attending: Family Medicine | Admitting: Family Medicine

## 2013-06-19 ENCOUNTER — Ambulatory Visit (INDEPENDENT_AMBULATORY_CARE_PROVIDER_SITE_OTHER): Payer: Medicare Other | Admitting: Family Medicine

## 2013-06-19 ENCOUNTER — Encounter: Payer: Self-pay | Admitting: Family Medicine

## 2013-06-19 ENCOUNTER — Encounter: Payer: Self-pay | Admitting: *Deleted

## 2013-06-19 VITALS — BP 132/82 | HR 86 | Temp 97.7°F | Ht 66.25 in

## 2013-06-19 DIAGNOSIS — M5137 Other intervertebral disc degeneration, lumbosacral region: Secondary | ICD-10-CM | POA: Diagnosis not present

## 2013-06-19 DIAGNOSIS — M549 Dorsalgia, unspecified: Secondary | ICD-10-CM | POA: Diagnosis not present

## 2013-06-19 DIAGNOSIS — M545 Low back pain, unspecified: Secondary | ICD-10-CM | POA: Diagnosis not present

## 2013-06-19 DIAGNOSIS — M47817 Spondylosis without myelopathy or radiculopathy, lumbosacral region: Secondary | ICD-10-CM | POA: Diagnosis not present

## 2013-06-19 LAB — POCT URINALYSIS DIPSTICK
Bilirubin, UA: NEGATIVE
GLUCOSE UA: NEGATIVE
Ketones, UA: NEGATIVE
Nitrite, UA: NEGATIVE
PH UA: 6
Spec Grav, UA: 1.025
UROBILINOGEN UA: 0.2

## 2013-06-19 MED ORDER — CYCLOBENZAPRINE HCL 10 MG PO TABS: 10.0000 mg | ORAL_TABLET | Freq: Three times a day (TID) | ORAL | Status: DC | PRN

## 2013-06-19 NOTE — Progress Notes (Signed)
Pre-visit discussion using our clinic review tool. No additional management support is needed unless otherwise documented below in the visit note.  

## 2013-06-19 NOTE — Patient Instructions (Signed)
Takes aleve if you need it (with food)  Use heat on affected area  Walk- walking helps back pain  Try the flexeril - use caution of sedation Xray today  Come back to leave urine specimen when you can

## 2013-06-19 NOTE — Progress Notes (Signed)
Subjective:    Patient ID: Michele Bryant, female    DOB: 1945/08/27, 68 y.o.   MRN: [id number]  HPI Here with back pain  Pt has to lift her mother's wheelchair and help her get around  Pain started 2 wk ago  Did her PT exercises from the past and took aleve- and this helped  Last week it flared up again -intermittently   Prior dx - muscular  Low back pain  Worse on the R side (it moves and does radiate down leg )  No numbness/ is burning however    ua (few drops) -had pos dip No urinary symptoms  No blood in urine   Results for orders placed in visit on 06/19/13  POCT URINALYSIS DIPSTICK      Result Value Range   Color, UA yellow     Clarity, UA       Glucose, UA neg.     Bilirubin, UA neg.     Ketones, UA neg.     Spec Grav, UA 1.025     Blood, UA moderate     pH, UA 6.0     Protein, UA trace     Urobilinogen, UA 0.2     Nitrite, UA neg.     Leukocytes, UA small (1+)       Patient Active Problem List   Diagnosis Date Noted  . Encounter for Medicare annual wellness exam 05/25/2013  . GERD (gastroesophageal reflux disease) 04/07/2013  . Abdominal pain 01/25/2012  . Family history of pancreatic cancer 07/01/2011  . Low TSH level 06/29/2011  . Family history of osteoporosis 06/12/2011  . Post-menopausal 06/12/2011  . 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 TIBIALIS TENDINITIS 06/30/2009  . ACROMIOCLAVICULAR JOINT SEPARATION, RIGHT 06/30/2009  . FASTING HYPERGLYCEMIA 08/30/2008  . OBESITY 05/18/2008  . HYPERLIPIDEMIA 09/03/2007  . TOBACCO USE, QUIT 09/03/2007  . GLAUCOMA 11/26/2006  . HYPERTENSION 11/26/2006  . CARDIOMYOPATHY 11/26/2006  . ALLERGIC RHINITIS 11/26/2006  . BREAST CANCER, HX OF 11/26/2006   Past Medical History  Diagnosis Date  . Arthritis   . Cancer     breast  . Chicken pox   . Hypertension   . Measles   . Allergy     allergic rhinitis  . Hyperlipidemia   . Obesity   . Cataract     mild  . Lactose intolerance   .  Hyperglycemia   . Allergic rhinitis    Past Surgical History  Procedure Laterality Date  . Mastectomy    . Knee surgery    . Hand surgery      tendon   History  Substance Use Topics  . Smoking status: Former Smoker    Types: Cigarettes    Quit date: 05/14/2004  . Smokeless tobacco: Never Used  . Alcohol Use: Yes     Comment: occasional   Family History  Problem Relation Age of Onset  . Heart disease Father     CHF  . Heart failure Father   . Hypertension Father   . Cancer Sister     pancreatic CA  . Heart disease Brother     MI  . Pancreatic cancer Brother    Allergies  Allergen Reactions  . Hydrocodone Itching   Current Outpatient Prescriptions on File Prior to Visit  Medication Sig Dispense Refill  . amLODipine (NORVASC) 5 MG tablet TAKE 1 TABLET BY MOUTH DAILY.  90 tablet  1  . aspirin 81 MG EC tablet Take 81  mg by mouth daily.        . benazepril-hydrochlorthiazide (LOTENSIN HCT) 20-12.5 MG per tablet TAKE 2 TABLETS BY MOUTH DAILY.  180 tablet  1  . brimonidine (ALPHAGAN P) 0.1 % SOLN Place 1 drop into both eyes every 8 (eight) hours.        . cholecalciferol (VITAMIN D) 1000 UNITS tablet Take 2,000-3,000 Units by mouth daily.      . Coenzyme Q10 (COQ10) 100 MG CAPS Take 1 tablet by mouth daily.      Marland Kitchen omeprazole (PRILOSEC) 20 MG capsule Take 1 capsule (20 mg total) by mouth daily.  90 capsule  3  . travoprost, benzalkonium, (TRAVATAN) 0.004 % ophthalmic solution 1 drop at bedtime.         No current facility-administered medications on file prior to visit.    Review of Systems Review of Systems  Constitutional: Negative for fever, appetite change, fatigue and unexpected weight change.  Eyes: Negative for pain and visual disturbance.  Respiratory: Negative for cough and shortness of breath.   Cardiovascular: Negative for cp or palpitations    Gastrointestinal: Negative for nausea, diarrhea and constipation.  Genitourinary: Negative for urgency and frequency.  neg for dysuria or flank pain  Skin: Negative for pallor or rash   Neurological: Negative for weakness, light-headedness, numbness and headaches.  Hematological: Negative for adenopathy. Does not bruise/bleed easily.  Psychiatric/Behavioral: Negative for dysphoric mood. The patient is not nervous/anxious.         Objective:   Physical Exam  Constitutional: She appears well-developed and well-nourished.  HENT:  Head: Normocephalic and atraumatic.  Neck: Normal range of motion. Neck supple.  Cardiovascular: Normal rate and regular rhythm.   Pulmonary/Chest: Effort normal and breath sounds normal.  Abdominal: Soft. Bowel sounds are normal. There is no tenderness.  No cva tenderness   Musculoskeletal: She exhibits tenderness. She exhibits no edema.       Lumbar back: She exhibits decreased range of motion, tenderness and spasm. She exhibits no bony tenderness, no edema and no deformity.  Tender in musculature to R of LS / also piriformis area  SLR yields back and buttock pain Rom : flex 90 deg/ ext 10 dg with pain Nl gait  No neuro findings   Neurological: She is alert. She has normal reflexes. She displays no atrophy and no tremor. No cranial nerve deficit or sensory deficit. She exhibits normal muscle tone. Coordination and gait normal.  Skin: Skin is warm and dry. No rash noted.  Psychiatric: She has a normal mood and affect.          Assessment & Plan:

## 2013-06-21 DIAGNOSIS — M545 Low back pain, unspecified: Secondary | ICD-10-CM | POA: Insufficient documentation

## 2013-06-21 NOTE — Assessment & Plan Note (Signed)
L sided pain intermittent- but worse lately after lifting  Some symptoms of sciatica Trial of muscle relaxer Heat/ walking Xray today and update  ua was abn today- dip / but only a few drops so will need to repeat this

## 2013-06-22 ENCOUNTER — Telehealth: Payer: Self-pay | Admitting: Family Medicine

## 2013-06-22 ENCOUNTER — Other Ambulatory Visit: Payer: Medicare Other

## 2013-06-22 DIAGNOSIS — M545 Low back pain, unspecified: Secondary | ICD-10-CM

## 2013-06-22 DIAGNOSIS — M5137 Other intervertebral disc degeneration, lumbosacral region: Secondary | ICD-10-CM

## 2013-06-22 NOTE — Telephone Encounter (Signed)
Message copied by Abner Greenspan on Mon Jun 22, 2013  9:23 PM ------      Message from: Tammi Sou      Created: Mon Jun 22, 2013  3:03 PM       Pt notified of xray results and Dr. Marliss Coots comments, pt agrees with referral to ortho, I advise pt that Marion/Linda will call to schedule appt ------

## 2013-06-23 LAB — POCT URINALYSIS DIPSTICK
Bilirubin, UA: NEGATIVE
Blood, UA: NEGATIVE
GLUCOSE UA: NEGATIVE
Ketones, UA: NEGATIVE
Leukocytes, UA: NEGATIVE
Nitrite, UA: NEGATIVE
PH UA: 6
Protein, UA: NEGATIVE
Spec Grav, UA: 1.01
Urobilinogen, UA: 0.2

## 2013-06-23 NOTE — Addendum Note (Signed)
Addended by: Tammi Sou on: 06/23/2013 08:09 AM   Modules accepted: Orders

## 2013-07-07 DIAGNOSIS — IMO0002 Reserved for concepts with insufficient information to code with codable children: Secondary | ICD-10-CM | POA: Diagnosis not present

## 2013-07-20 DIAGNOSIS — M545 Low back pain, unspecified: Secondary | ICD-10-CM | POA: Diagnosis not present

## 2013-07-24 DIAGNOSIS — M545 Low back pain, unspecified: Secondary | ICD-10-CM | POA: Diagnosis not present

## 2013-07-24 DIAGNOSIS — IMO0002 Reserved for concepts with insufficient information to code with codable children: Secondary | ICD-10-CM | POA: Diagnosis not present

## 2013-07-29 DIAGNOSIS — M545 Low back pain, unspecified: Secondary | ICD-10-CM | POA: Diagnosis not present

## 2013-07-29 DIAGNOSIS — IMO0002 Reserved for concepts with insufficient information to code with codable children: Secondary | ICD-10-CM | POA: Diagnosis not present

## 2013-08-03 ENCOUNTER — Other Ambulatory Visit: Payer: Self-pay | Admitting: *Deleted

## 2013-08-03 MED ORDER — AMLODIPINE BESYLATE 5 MG PO TABS: ORAL_TABLET | ORAL | Status: DC

## 2013-08-03 MED ORDER — BENAZEPRIL-HYDROCHLOROTHIAZIDE 20-12.5 MG PO TABS: ORAL_TABLET | ORAL | Status: DC

## 2013-08-04 DIAGNOSIS — M545 Low back pain, unspecified: Secondary | ICD-10-CM | POA: Diagnosis not present

## 2013-08-06 DIAGNOSIS — M545 Low back pain, unspecified: Secondary | ICD-10-CM | POA: Diagnosis not present

## 2013-08-06 DIAGNOSIS — IMO0002 Reserved for concepts with insufficient information to code with codable children: Secondary | ICD-10-CM | POA: Diagnosis not present

## 2013-08-11 DIAGNOSIS — M545 Low back pain, unspecified: Secondary | ICD-10-CM | POA: Diagnosis not present

## 2013-08-13 DIAGNOSIS — M545 Low back pain, unspecified: Secondary | ICD-10-CM | POA: Diagnosis not present

## 2013-08-13 DIAGNOSIS — IMO0002 Reserved for concepts with insufficient information to code with codable children: Secondary | ICD-10-CM | POA: Diagnosis not present

## 2013-08-18 DIAGNOSIS — IMO0002 Reserved for concepts with insufficient information to code with codable children: Secondary | ICD-10-CM | POA: Diagnosis not present

## 2013-08-18 DIAGNOSIS — M545 Low back pain, unspecified: Secondary | ICD-10-CM | POA: Diagnosis not present

## 2013-08-24 DIAGNOSIS — IMO0002 Reserved for concepts with insufficient information to code with codable children: Secondary | ICD-10-CM | POA: Diagnosis not present

## 2013-08-24 DIAGNOSIS — M545 Low back pain, unspecified: Secondary | ICD-10-CM | POA: Diagnosis not present

## 2013-10-01 DIAGNOSIS — H409 Unspecified glaucoma: Secondary | ICD-10-CM | POA: Diagnosis not present

## 2013-10-01 DIAGNOSIS — H4011X0 Primary open-angle glaucoma, stage unspecified: Secondary | ICD-10-CM | POA: Diagnosis not present

## 2014-01-15 ENCOUNTER — Ambulatory Visit (INDEPENDENT_AMBULATORY_CARE_PROVIDER_SITE_OTHER): Payer: Medicare Other

## 2014-01-15 DIAGNOSIS — Z23 Encounter for immunization: Secondary | ICD-10-CM | POA: Diagnosis not present

## 2014-02-10 ENCOUNTER — Telehealth: Payer: Self-pay

## 2014-02-10 NOTE — Telephone Encounter (Signed)
Pt left /vm requesting order for breast prosthesis and 6 bras sent to [facility]; order needs to state pt has had rt mastectomy. Pt has appt at [facility] this afternoon.[facility]'s fax # is [phone].

## 2014-02-10 NOTE — Telephone Encounter (Signed)
Order is in IN box 

## 2014-02-12 NOTE — Telephone Encounter (Signed)
Patient notified that order has been faxed over per her request.

## 2014-02-22 ENCOUNTER — Telehealth: Payer: Self-pay

## 2014-02-22 NOTE — Telephone Encounter (Signed)
Pt left v/m returning a call; cannot find who called pt but left v/m for pt to cb.

## 2014-02-24 NOTE — Telephone Encounter (Signed)
Spoke with pt and pt said Rosaria Ferries was the person who left her a message to cb; offered to transfer call to Rosaria Ferries but pt is presently in [place] and will call Rosaria Ferries back upon pts return re; allergy appt.

## 2014-02-25 NOTE — Telephone Encounter (Signed)
I havent talked with this patient at all and there is no referral regarding an Allergy appt at all in her chart.

## 2014-03-04 DIAGNOSIS — H4011X3 Primary open-angle glaucoma, severe stage: Secondary | ICD-10-CM | POA: Diagnosis not present

## 2014-05-08 ENCOUNTER — Other Ambulatory Visit: Payer: Self-pay | Admitting: Family Medicine

## 2014-05-10 NOTE — Telephone Encounter (Signed)
Electronic refill request, no recent/future appt., please advise  

## 2014-05-10 NOTE — Telephone Encounter (Signed)
Please schedule f/u in Feb and refill until then

## 2014-05-11 NOTE — Telephone Encounter (Signed)
Left voicemail requesting pt to call office 

## 2014-05-12 NOTE — Telephone Encounter (Signed)
Left voicemail requesting pt to call office 

## 2014-05-12 NOTE — Telephone Encounter (Signed)
Pt left v/m returning call and requesting cb. 

## 2014-05-13 NOTE — Telephone Encounter (Signed)
Since the office will be closed until 05/17/14, I authorized a 90 day supply with a comment that pt needs to schedule appt in Feb.

## 2014-05-18 ENCOUNTER — Telehealth: Payer: Self-pay

## 2014-05-18 ENCOUNTER — Observation Stay: Payer: Self-pay | Admitting: Internal Medicine

## 2014-05-18 DIAGNOSIS — Z9221 Personal history of antineoplastic chemotherapy: Secondary | ICD-10-CM | POA: Diagnosis not present

## 2014-05-18 DIAGNOSIS — H409 Unspecified glaucoma: Secondary | ICD-10-CM | POA: Diagnosis not present

## 2014-05-18 DIAGNOSIS — H539 Unspecified visual disturbance: Secondary | ICD-10-CM | POA: Diagnosis not present

## 2014-05-18 DIAGNOSIS — Z853 Personal history of malignant neoplasm of breast: Secondary | ICD-10-CM | POA: Diagnosis not present

## 2014-05-18 DIAGNOSIS — I071 Rheumatic tricuspid insufficiency: Secondary | ICD-10-CM | POA: Diagnosis not present

## 2014-05-18 DIAGNOSIS — R4789 Other speech disturbances: Secondary | ICD-10-CM | POA: Diagnosis not present

## 2014-05-18 DIAGNOSIS — I10 Essential (primary) hypertension: Secondary | ICD-10-CM | POA: Diagnosis not present

## 2014-05-18 DIAGNOSIS — I499 Cardiac arrhythmia, unspecified: Secondary | ICD-10-CM | POA: Diagnosis not present

## 2014-05-18 DIAGNOSIS — I350 Nonrheumatic aortic (valve) stenosis: Secondary | ICD-10-CM | POA: Diagnosis not present

## 2014-05-18 DIAGNOSIS — Z6831 Body mass index (BMI) 31.0-31.9, adult: Secondary | ICD-10-CM | POA: Diagnosis not present

## 2014-05-18 DIAGNOSIS — L299 Pruritus, unspecified: Secondary | ICD-10-CM | POA: Diagnosis not present

## 2014-05-18 DIAGNOSIS — G459 Transient cerebral ischemic attack, unspecified: Secondary | ICD-10-CM | POA: Diagnosis not present

## 2014-05-18 DIAGNOSIS — K219 Gastro-esophageal reflux disease without esophagitis: Secondary | ICD-10-CM | POA: Diagnosis not present

## 2014-05-18 DIAGNOSIS — R200 Anesthesia of skin: Secondary | ICD-10-CM | POA: Diagnosis not present

## 2014-05-18 DIAGNOSIS — Z79899 Other long term (current) drug therapy: Secondary | ICD-10-CM | POA: Diagnosis not present

## 2014-05-18 DIAGNOSIS — R4701 Aphasia: Secondary | ICD-10-CM | POA: Diagnosis not present

## 2014-05-18 DIAGNOSIS — Z800 Family history of malignant neoplasm of digestive organs: Secondary | ICD-10-CM | POA: Diagnosis not present

## 2014-05-18 DIAGNOSIS — E669 Obesity, unspecified: Secondary | ICD-10-CM | POA: Diagnosis not present

## 2014-05-18 DIAGNOSIS — I6523 Occlusion and stenosis of bilateral carotid arteries: Secondary | ICD-10-CM | POA: Diagnosis not present

## 2014-05-18 DIAGNOSIS — I340 Nonrheumatic mitral (valve) insufficiency: Secondary | ICD-10-CM | POA: Diagnosis not present

## 2014-05-18 DIAGNOSIS — Z885 Allergy status to narcotic agent status: Secondary | ICD-10-CM | POA: Diagnosis not present

## 2014-05-18 DIAGNOSIS — R51 Headache: Secondary | ICD-10-CM | POA: Diagnosis not present

## 2014-05-18 DIAGNOSIS — E785 Hyperlipidemia, unspecified: Secondary | ICD-10-CM | POA: Diagnosis not present

## 2014-05-18 LAB — CBC WITH DIFFERENTIAL/PLATELET
BASOS ABS: 0.1 10*3/uL (ref 0.0–0.1)
Basophil %: 1.1 %
EOS ABS: 0.1 10*3/uL (ref 0.0–0.7)
Eosinophil %: 1 %
HCT: 34.7 % — ABNORMAL LOW (ref 35.0–47.0)
HGB: 11.6 g/dL — ABNORMAL LOW (ref 12.0–16.0)
Lymphocyte #: 2.8 10*3/uL (ref 1.0–3.6)
Lymphocyte %: 37.4 %
MCH: 33.7 pg (ref 26.0–34.0)
MCHC: 33.5 g/dL (ref 32.0–36.0)
MCV: 101 fL — ABNORMAL HIGH (ref 80–100)
MONOS PCT: 10.3 %
Monocyte #: 0.8 x10 3/mm (ref 0.2–0.9)
NEUTROS ABS: 3.7 10*3/uL (ref 1.4–6.5)
NEUTROS PCT: 50.2 %
PLATELETS: 229 10*3/uL (ref 150–440)
RBC: 3.46 10*6/uL — ABNORMAL LOW (ref 3.80–5.20)
RDW: 13.2 % (ref 11.5–14.5)
WBC: 7.4 10*3/uL (ref 3.6–11.0)

## 2014-05-18 LAB — COMPREHENSIVE METABOLIC PANEL
ALBUMIN: 3.6 g/dL (ref 3.4–5.0)
ALK PHOS: 76 U/L
Anion Gap: 7 (ref 7–16)
BILIRUBIN TOTAL: 0.4 mg/dL (ref 0.2–1.0)
BUN: 22 mg/dL — ABNORMAL HIGH (ref 7–18)
CALCIUM: 8.9 mg/dL (ref 8.5–10.1)
CO2: 29 mmol/L (ref 21–32)
CREATININE: 1.25 mg/dL (ref 0.60–1.30)
Chloride: 107 mmol/L (ref 98–107)
EGFR (African American): 55 — ABNORMAL LOW
EGFR (Non-African Amer.): 45 — ABNORMAL LOW
Glucose: 112 mg/dL — ABNORMAL HIGH (ref 65–99)
Osmolality: 289 (ref 275–301)
Potassium: 3.4 mmol/L — ABNORMAL LOW (ref 3.5–5.1)
SGOT(AST): 27 U/L (ref 15–37)
SGPT (ALT): 21 U/L
SODIUM: 143 mmol/L (ref 136–145)
Total Protein: 7.2 g/dL (ref 6.4–8.2)

## 2014-05-18 LAB — TROPONIN I: Troponin-I: <0.02 ng/mL

## 2014-05-18 NOTE — Telephone Encounter (Signed)
Agree with adv to go to ER Please get records from [facility] when ready

## 2014-05-18 NOTE — Telephone Encounter (Signed)
PLEASE NOTE: All timestamps contained within this report are represented as [place] Standard Time. CONFIDENTIALTY NOTICE: This fax transmission is intended only for the addressee. It contains information that is legally privileged, confidential or otherwise protected from use or disclosure. If you are not the intended recipient, you are strictly prohibited from reviewing, disclosing, copying using or disseminating any of this information or taking any action in reliance on or regarding this information. If you have received this fax in error, please notify us immediately by telephone so that we can arrange for its return to [place]. Phone: [phone], Toll-Free: [phone], Fax: [phone] Page: 1 of 2 Call Id: [id number] [place] Patient Name: Michele Bryant Gender: Female DOB: May 16, 1945 Age: 69 Y 35 M 28 D Return Phone Number: [phone] (Primary), [phone] (Secondary) Address: [address] [place] [address] Client [facility] Night - Client Client Site [facility] [place] Contact Type Call Call Type Triage / Clinical Relationship To Patient Self Return Phone Number [phone] (Primary) Chief Complaint SPEAK - sudden inability to talk or slurred speech Initial Comment Caller states unable to talk for a few minutes after lifting mom over curb in wheelchair, followed by slurred speech, blurred vision, and fatigue. Now left eye is red, c/o some soreness in shoulder PreDisposition Did not know what to do Nurse Assessment Nurse: Neil Crouch, RN, Baker Janus Date/Time Eilene Ghazi Time): 05/18/2014 12:34:45 AM Confirm and document reason for call. If symptomatic, describe symptoms. ---Caller states unable to talk for a few minutes after lifting mom over curb in wheelchair, followed by slurred speech,  blurred vision, and fatigue. Now left eye is red, c/o some soreness in shoulder . happened at 2000. did take an aspirin. Has the patient traveled out of the country within the last 30 days? ---Not Applicable Does the patient require triage? ---Yes Related visit to physician within the last 2 weeks? ---No Does the PT have any chronic conditions? (i.e. diabetes, asthma, etc.) ---Yes List chronic conditions. ---htn Guidelines Guideline Title Affirmed Question Affirmed Notes Nurse Date/Time (Eastern Time) Neurologic Deficit [1] Loss of speech or garbled speech AND [2] sudden onset AND [3] transient (i.e., completely resolved) Neil Crouch, RN, Baker Janus 05/18/2014 12:38:17 AM Disp. Time Eilene Ghazi Time) Disposition Final User 05/18/2014 12:32:18 AM Send to Urgent Lequita Asal, [place] 05/18/2014 12:41:30 AM Call Completed Neil Crouch RN, Baker Janus 05/18/2014 12:41:05 AM Go to ED Now (or PCP triage) Yes Neil Crouch, RN, Juleen China NOTE: All timestamps contained within this report are represented as [place] Standard Time. CONFIDENTIALTY NOTICE: This fax transmission is intended only for the addressee. It contains information that is legally privileged, confidential or otherwise protected from use or disclosure. If you are not the intended recipient, you are strictly prohibited from reviewing, disclosing, copying using or disseminating any of this information or taking any action in reliance on or regarding this information. If you have received this fax in error, please notify us immediately by telephone so that we can arrange for its return to [place]. Phone: [phone], Toll-Free: [phone], Fax: [phone] Page: 2 of 2 Call Id: [id number] Caller Understands: Yes Disagree/Comply: Comply Care Advice Given Per Guideline GO TO ED NOW (OR PCP TRIAGE): DRIVING: Another adult should drive. CARE ADVICE given per Neurologic Deficit (Adult) guideline. * IF NO PCP TRIAGE: You need to be seen. Go to the [facility] at  [facility] within the next hour. Leave as soon as  you can. After Care Instructions Given Call Event Type User Date / Time Description Referrals [facility] - ED

## 2014-05-20 NOTE — Telephone Encounter (Signed)
Pt did go to hospital and notes from [facility] requested

## 2014-05-21 ENCOUNTER — Other Ambulatory Visit: Payer: Self-pay

## 2014-05-21 DIAGNOSIS — Z1231 Encounter for screening mammogram for malignant neoplasm of breast: Secondary | ICD-10-CM

## 2014-05-31 ENCOUNTER — Encounter: Payer: Self-pay | Admitting: Family Medicine

## 2014-05-31 ENCOUNTER — Ambulatory Visit (INDEPENDENT_AMBULATORY_CARE_PROVIDER_SITE_OTHER): Payer: Medicare Other | Admitting: Family Medicine

## 2014-05-31 VITALS — BP 136/74 | HR 80 | Temp 98.1°F | Ht 66.5 in | Wt 204.5 lb

## 2014-05-31 DIAGNOSIS — G459 Transient cerebral ischemic attack, unspecified: Secondary | ICD-10-CM | POA: Diagnosis not present

## 2014-05-31 DIAGNOSIS — I10 Essential (primary) hypertension: Secondary | ICD-10-CM

## 2014-05-31 NOTE — Patient Instructions (Signed)
Increase your aspirin to 325 mg once daily  If any stroke symptoms return- call 911 to get to ER immediately  Eat a low fat/ low sodium diet  Keep working on weight loss See the cardiologist at planned tomorrow

## 2014-05-31 NOTE — Assessment & Plan Note (Signed)
Rev hospital records and studies incl CT, MRI, 2D echo, carotid dopplers No source of embolus found inst to inc asa to 325 mg daily  F/u with cardiol tomorrow - ? If they will consider fxn study or monitor (loop recorder) No s/s of sleep apnea  Disc poss of small vessel dz Understands imp of good chol and bp control  If symptoms re occur in the meantime she will call 911  Reassuring exam today  Handout given

## 2014-05-31 NOTE — Assessment & Plan Note (Signed)
Well controlled s/p presumed tia  Will continue current medications Also disc lifestyle habits-she is working on wt loss (doing well) Copy of DASH diet given  For cardiol f/u tomorrow

## 2014-05-31 NOTE — Progress Notes (Signed)
Pre visit review using our clinic review tool, if applicable. No additional management support is needed unless otherwise documented below in the visit note. 

## 2014-05-31 NOTE — Progress Notes (Signed)
Subjective:    Patient ID: Michele Bryant, female    DOB: 1945-07-17, 69 y.o.   MRN: [id number]  HPI   f/u of hosp at [facility] 05/18/14 for presumed TIA [facility]  Overnight stay   Started with L facial numbness and speech problems - lasting 15 min / and blurred vision in L eye Resolved by time of hosp  This happened when she was trying to pull her mother's wheelchair up on the sidewalk - was really pulling    CT head neg MRI - neg  Carotid doppler - less than 50 % stenosis either side  2D echo - did not show a source of embili either - [facility] - EG 60-65%  HB of 11.6 Glucose 112  Other labs unremarkable   No residual effects   She was also aspirin - 81 mg  They did not start statin due to hx of problems with it - had her start fish oil    Lab Results  Component Value Date   CHOL 192 05/25/2013   HDL 63.50 05/25/2013   LDLCALC 104* 05/25/2013   LDLDIRECT 91.1 06/13/2006   TRIG 124.0 05/25/2013   CHOLHDL 3 05/25/2013    Last profile was good   Has appt with cardiologist for tomorrow - Dr Clayborn Bigness    Her brother had a stroke (when he underwent treatment for pancreatic cancer)  No other fam hx  No fam hx of clotting   Patient Active Problem List   Diagnosis Date Noted  . TIA (transient ischemic attack) 05/31/2014  . Low back pain 06/21/2013  . Encounter for Medicare annual wellness exam 05/25/2013  . GERD (gastroesophageal reflux disease) 04/07/2013  . Abdominal pain 01/25/2012  . Family history of pancreatic cancer 07/01/2011  . Low TSH level 06/29/2011  . Family history of osteoporosis 06/12/2011  . Post-menopausal 06/12/2011  . 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 TIBIALIS TENDINITIS 06/30/2009  . ACROMIOCLAVICULAR JOINT SEPARATION, RIGHT 06/30/2009  . FASTING HYPERGLYCEMIA 08/30/2008  . OBESITY 05/18/2008  . HYPERLIPIDEMIA 09/03/2007  . TOBACCO USE, QUIT 09/03/2007  . GLAUCOMA 11/26/2006  . Essential hypertension 11/26/2006  . CARDIOMYOPATHY  11/26/2006  . ALLERGIC RHINITIS 11/26/2006  . BREAST CANCER, HX OF 11/26/2006   Past Medical History  Diagnosis Date  . Arthritis   . Cancer     breast  . Chicken pox   . Hypertension   . Measles   . Allergy     allergic rhinitis  . Hyperlipidemia   . Obesity   . Cataract     mild  . Lactose intolerance   . Hyperglycemia   . Allergic rhinitis    Past Surgical History  Procedure Laterality Date  . Mastectomy    . Knee surgery    . Hand surgery      tendon   History  Substance Use Topics  . Smoking status: Former Smoker    Types: Cigarettes    Quit date: 05/14/2004  . Smokeless tobacco: Never Used  . Alcohol Use: Yes     Comment: occasional   Family History  Problem Relation Age of Onset  . Heart disease Father     CHF  . Heart failure Father   . Hypertension Father   . Cancer Sister     pancreatic CA  . Heart disease Brother     MI  . Pancreatic cancer Brother    Allergies  Allergen Reactions  . Hydrocodone Itching   Current Outpatient Prescriptions on File  Prior to Visit  Medication Sig Dispense Refill  . amLODipine (NORVASC) 5 MG tablet Take 1 tablet (5 mg total) by mouth daily. *Please schedule appointment with Dr Glori Bickers in Feb 2016 for additional refills* 90 tablet 0  . aspirin 81 MG EC tablet Take 81 mg by mouth daily.      . benazepril-hydrochlorthiazide (LOTENSIN HCT) 20-12.5 MG per tablet Take 2 tablets by mouth daily. *Please schedule appointment with Dr Glori Bickers in Feb 2016 for additional refills* 180 tablet 0  . brimonidine (ALPHAGAN P) 0.1 % SOLN Place 1 drop into both eyes every 8 (eight) hours.      . cholecalciferol (VITAMIN D) 1000 UNITS tablet Take 2,000-3,000 Units by mouth daily.    . Coenzyme Q10 (COQ10) 100 MG CAPS Take 1 tablet by mouth daily.    . cyclobenzaprine (FLEXERIL) 10 MG tablet Take 1 tablet (10 mg total) by mouth 3 (three) times daily as needed for muscle spasms (use caution for sedation). 30 tablet 1  . omeprazole (PRILOSEC)  20 MG capsule Take 1 capsule (20 mg total) by mouth daily. (Patient taking differently: Take 20 mg by mouth daily as needed. ) 90 capsule 3   No current facility-administered medications on file prior to visit.      Review of Systems Review of Systems  Constitutional: Negative for fever, appetite change, fatigue and unexpected weight change.  Eyes: Negative for pain and visual disturbance.  Respiratory: Negative for cough and shortness of breath.   Cardiovascular: Negative for cp or palpitations    Gastrointestinal: Negative for nausea, diarrhea and constipation.  Genitourinary: Negative for urgency and frequency.  Skin: Negative for pallor or rash   Neurological: Negative for weakness, light-headedness, numbness and headaches.  Hematological: Negative for adenopathy. Does not bruise/bleed easily.  Psychiatric/Behavioral: Negative for dysphoric mood. The patient is not nervous/anxious.         Objective:   Physical Exam  Constitutional: She is oriented to person, place, and time. She appears well-developed and well-nourished. No distress.  obese and well appearing   HENT:  Head: Normocephalic and atraumatic.  Mouth/Throat: Oropharynx is clear and moist.  No facial droop   Eyes: Conjunctivae and EOM are normal. Pupils are equal, round, and reactive to light. No scleral icterus.  Neck: Normal range of motion. Neck supple. No JVD present. Carotid bruit is not present.  Cardiovascular: Normal rate, regular rhythm and normal heart sounds.  Exam reveals no gallop.   Pulmonary/Chest: Effort normal and breath sounds normal. No respiratory distress. She has no wheezes. She has no rales.  No crackles   Musculoskeletal: She exhibits no edema or tenderness.  Lymphadenopathy:    She has no cervical adenopathy.  Neurological: She is alert and oriented to person, place, and time. She has normal reflexes. She displays no atrophy and no tremor. No cranial nerve deficit or sensory deficit. She  exhibits normal muscle tone. She displays a negative Romberg sign. Coordination and gait normal.  Skin: Skin is warm and dry. No rash noted. No erythema. No pallor.  Psychiatric: She has a normal mood and affect.  Pleasant and talkative           Assessment & Plan:   Problem List Items Addressed This Visit      Cardiovascular and Mediastinum   Essential hypertension    Well controlled s/p presumed tia  Will continue current medications Also disc lifestyle habits-she is working on wt loss (doing well) Copy of DASH diet given  For cardiol  f/u tomorrow       TIA (transient ischemic attack) - Primary    Rev hospital records and studies incl CT, MRI, 2D echo, carotid dopplers No source of embolus found inst to inc asa to 325 mg daily  F/u with cardiol tomorrow - ? If they will consider fxn study or monitor (loop recorder) No s/s of sleep apnea  Disc poss of small vessel dz Understands imp of good chol and bp control  If symptoms re occur in the meantime she will call 911  Reassuring exam today  Handout given

## 2014-06-01 DIAGNOSIS — E669 Obesity, unspecified: Secondary | ICD-10-CM | POA: Diagnosis not present

## 2014-06-01 DIAGNOSIS — I10 Essential (primary) hypertension: Secondary | ICD-10-CM | POA: Diagnosis not present

## 2014-06-01 DIAGNOSIS — G459 Transient cerebral ischemic attack, unspecified: Secondary | ICD-10-CM | POA: Diagnosis not present

## 2014-06-01 DIAGNOSIS — K219 Gastro-esophageal reflux disease without esophagitis: Secondary | ICD-10-CM | POA: Diagnosis not present

## 2014-06-07 ENCOUNTER — Encounter: Payer: Self-pay | Admitting: Family Medicine

## 2014-06-14 DIAGNOSIS — G459 Transient cerebral ischemic attack, unspecified: Secondary | ICD-10-CM | POA: Diagnosis not present

## 2014-06-21 ENCOUNTER — Other Ambulatory Visit: Payer: Self-pay

## 2014-06-21 ENCOUNTER — Ambulatory Visit
Admission: RE | Admit: 2014-06-21 | Discharge: 2014-06-21 | Disposition: A | Discharge status: Home / Self Care | Payer: Medicare Other | Source: Ambulatory Visit

## 2014-06-21 DIAGNOSIS — Z1231 Encounter for screening mammogram for malignant neoplasm of breast: Secondary | ICD-10-CM

## 2014-07-05 DIAGNOSIS — H4011X3 Primary open-angle glaucoma, severe stage: Secondary | ICD-10-CM | POA: Diagnosis not present

## 2014-07-05 DIAGNOSIS — H524 Presbyopia: Secondary | ICD-10-CM | POA: Diagnosis not present

## 2014-08-09 DIAGNOSIS — H4011X3 Primary open-angle glaucoma, severe stage: Secondary | ICD-10-CM | POA: Diagnosis not present

## 2014-08-10 ENCOUNTER — Other Ambulatory Visit: Payer: Self-pay | Admitting: Family Medicine

## 2014-09-12 NOTE — H&P (Signed)
PATIENT NAME:  Michele Bryant, Michele Bryant MR#:  [id number] DATE OF BIRTH:  06-27-1945  DATE OF ADMISSION:  05/18/2014  REFERRING PHYSICIAN: Marjean Donna, MD   PRIMARY CARE PHYSICIAN: Loura Pardon, MD  REASON FOR ADMISSION: TIA.   HISTORY OF PRESENT ILLNESS: This is a 69 year old African American female who presents to the Emergency Department after an episode of aphasia. The patient states that it lasted 5 to 10 minutes after which she was then able to speak, but was aware she was incomprehensible. This lasted for a few minutes after the initial episode of aphasia, which was also accompanied by some left side facial numbness. The patient states that it was "feeling funny". She also admits to some change in her vision, which she describes "a film over her left eye, maybe her right eye as well". She admits that her left eye also began to itch some at the time and she noticed afterwards that she had a ruptured blood vessel in her sclera. By the time the patient arrived to the Emergency Department all of her symptoms had resolved. She denied any chest pain, shortness of breath, nausea, vomiting or diaphoresis with this episode. The patient was sitting at the time all of these things occurred and thus does not know how her gait or balance was at the time. Due to the nature of her symptoms, the Emergency Department called for admission for TIA work-up.   REVIEW OF SYSTEMS: CONSTITUTIONAL: The patient denies fever or weakness.  EYES: Admits to blurred vision during that episode described above, but denies blurred vision. She admits to inflammation now that she has rubbed her eye. EARS, NOSE AND THROAT: Denies tinnitus or difficulty swallowing.  RESPIRATORY: Denies cough or shortness of breath.  CARDIOVASCULAR: Denies chest pain, orthopnea, palpitations or paroxysmal nocturnal dyspnea.  GASTROINTESTINAL: Denies nausea, vomiting, diarrhea, or abdominal pain.  GENITOURINARY: Denies dysuria, increased frequency, or  hesitancy of urination.  ENDOCRINE: Denies polyuria or polydipsia.  HEMATOLOGIC AND LYMPHATIC: Denies easy bruising or bleeding.  INTEGUMENT: Denies rashes or lesions.  MUSCULOSKELETAL: Denies arthralgias or myalgias.  NEUROLOGIC: Admits to numbness on the left side of her face at the time of the episode previously described. She also had significant dysarthria and aphasia.  PSYCHIATRIC: Denies depression or suicidal ideation.   PAST MEDICAL HISTORY: Hypertension, gastroesophageal reflux disease, glaucoma, and a history of breast cancer.   PAST SURGICAL HISTORY: Right mastectomy, bilateral meniscal repair of her knees and cataract surgery of her left eye.  SOCIAL HISTORY: The patient occasionally drinks wine. She does not smoke or drink.   FAMILY HISTORY: She has 2 siblings who are deceased of pancreatic cancer. Her father passed from congestive heart failure and her oldest brother died of heart disease.   MEDICATIONS: 1.  Benazepril, dose unspecified. 2.  Amlodipine 5 mg 1 tablet p.o. daily.  ALLERGIES: HYDROCODONE.   PERTINENT LABORATORY RESULTS AND RADIOGRAPHIC FINDINGS: Serum glucose is 112, BUN 22, creatinine 1.25, sodium 143, serum potassium 3.4, chloride 107, bicarb 29, calcium 8.9, serum albumin 3.6, alkaline phosphatase 76, AST 27 and ALT 21. Troponin is negative. White blood cell count is 7.4, hemoglobin 11.6, hematocrit 34.7, platelet count 229,000 and MCV 101.  Head CT is negative for any acute intracranial process. There is some chronic atrophy and small vessel ischemic change.   PHYSICAL EXAMINATION: VITAL SIGNS: Temperature is 97.5, pulse 75, respirations 20, blood pressure 169/75 and pulse oximetry 98% on room air.  GENERAL: The patient is alert and oriented x3, in no apparent  distress.  HEENT: Normocephalic, atraumatic. Pupils equal, round, and reactive to light and accommodation. Extraocular movements are intact. Ophthalmologic exam shows normal fundi bilaterally. There  is a fleck of what appears to be a foreign body in the left eye, but may indeed just be a scar from previous cataract surgery. Mucous membranes are moist.  NECK: Trachea is midline. No adenopathy. Thyroid is nonpalpable, nontender.  CHEST: Symmetric. There is a well-healed mastectomy scar on the right.  CARDIOVASCULAR: Regular rate and rhythm. Normal S1, S2. No rubs, clicks, or murmurs appreciated. There are no carotid bruits bilaterally. The patient has 2+ pulses bilaterally in upper and lower extremities.  LUNGS: Clear to auscultation bilaterally. Normal effort and excursion.  ABDOMEN: Positive bowel sounds. Soft, nontender, nondistended. No hepatosplenomegaly.  GENITOURINARY: Deferred.  MUSCULOSKELETAL: The patient moves all 4 extremities equally. There is 5/5 strength in upper and lower extremities bilaterally.  SKIN: Warm and dry. No rashes or lesions.  EXTREMITIES: No clubbing, cyanosis, or edema.  NEUROLOGIC: Cranial nerves II through XII are grossly intact. There is no dysmetria. PSYCHIATRIC: The patient's mood is normal. Affect is congruent. She has excellent insight and judgment into her medical condition.   ASSESSMENT AND PLAN: This is a 69 year old female admitted for transient ischemic attack.  1.  Transient ischemic attack. The patient had an episode of aphasia for 5 to 10 minutes followed by fluency of speech that was strangely incomprehensible to her as well. We will admit the patient for a MRI of her brain and a neurologic consult.  2.  Hypertension. The patient reports that she is on benazepril and amlodipine. We will allow permissive hypertension for now. Will restart her home medicines 24 hours after the onset of symptoms.  3.  Gastroesophageal reflux disease. I have ordered a H2-blocker as needed.  4.  Obesity. The patient's BMI is 31.9. I have encouraged diet and exercise.  5.  Deep vein thrombosis prophylaxis. Heparin.  6.  Gastrointestinal prophylaxis. As above.   CODE  STATUS: The patient is a FULL code.   TIME SPENT ON ADMISSION ORDERS AND PATIENT CARE: Approximately 35 minutes.  ____________________________ Norva Riffle. Marcille Blanco, MD msd:sb D: 05/18/2014 06:42:36 ET T: 05/18/2014 07:47:56 ET JOB#: [id number]  cc: Norva Riffle. Marcille Blanco, MD, <Dictator> Norva Riffle Winry Egnew MD ELECTRONICALLY SIGNED 05/26/2014 22:56

## 2014-09-12 NOTE — Discharge Summary (Signed)
PATIENT NAME:  Michele Bryant, Michele Bryant MR#:  [id number] DATE OF BIRTH:  12/19/1945  DATE OF ADMISSION:  05/18/2014 DATE OF DISCHARGE:  05/18/2014  DISCHARGE DIAGNOSES: 1. Transient ischemic attack.  2. Hypertension.  3. Hyperlipidemia.   CONSULTS: Dr. Clayborn Bigness with cardiology.   IMAGING STUDIES DONE: Include:  1. A CT scan of the head which showed nothing acute.  2. An MRI of the brain showed no acute stroke, mass or bleed.  3. Ultrasound Doppler of the carotids showed less than 50% stenosis.  4. MRI of the brain showed nothing acute.   ADMITTING HISTORY AND PHYSICAL AND HOSPITAL COURSE: Please see detailed H and P dictated previously. In brief, a 69 year old female patient with history of hypertension presented to the hospital complaining of speech problems which lasted 10 to 15 minutes and these had resolved. The patient was admitted for further work-up of transient ischemic attack versus stroke.   HOSPITAL COURSE: Transient ischemic attack. The patient's MRI was normal. Symptoms had resolved completely. The patient is being started on aspirin. She cannot tolerate statins and will be started on fish oil. Also continue her blood pressure medications and close followup with her primary care physician. The patient had an MRI of the brain, echocardiogram and carotid Dopplers, which was normal. Prior to discharge, the patient has ambulated well and has no focal neurological deficits and is being discharged home in a stable condition.   DISCHARGE MEDICATIONS: 1. Aspirin 81 mg daily.  2. Amlodipine 5 mg daily.  3. Losartan/  hydrochlorothiazide   2 tablets oral daily.  4. Prilosec 20 mg daily.  5. Fish oil 1000 mg daily.   DISCHARGE INSTRUCTIONS: Low-sodium, low-fat diet. Activity as tolerated. Follow up with primary care physician in 1 to 2 weeks.    TIME SPENT TODAY ON DISCHARGE ACTIVITY: 40 minutes.    ____________________________ Leia Alf Sharley Keeler, MD srs:ST D: 05/19/2014 15:17:14  ET T: 05/19/2014 21:49:17 ET JOB#: [id number]  cc: Alveta Heimlich R. Tay Whitwell, MD, <Dictator> Neita Carp MD ELECTRONICALLY SIGNED 05/28/2014 12:50

## 2014-09-12 NOTE — Consult Note (Signed)
PATIENT NAME:  Michele Bryant, Michele Bryant MR#:  [id number] DATE OF BIRTH:  06-18-1945  DATE OF CONSULTATION:  05/18/2014  REFERRING PHYSICIAN:  Norva Riffle. Marcille Blanco, MD  CONSULTING PHYSICIAN:  Savion Washam D. Clayborn Bigness, MD  PRIMARY CARE PHYSICIAN: Marne A. Tower, MD   REASON FOR CONSULTATION: Transient ischemic attack, source of thrombus.    HISTORY OF PRESENT ILLNESS: The patient is a 69 year old African American female who presents with complaints of TIA symptoms. She had some aphasia and left arm numbness, lasted about 15 minutes, then she was able to speak again. Initially, she was incomprehensible, but eventually got better. Her symptoms lasted here, again, initially around 10-15 minutes, with left facial numbness. She was feeling funny prior and after that episode. The patient also admits to have some change in her vision, a lot of film came over her left eye, and her right eye was okay. She admits that her left eye also began to itch and at the time, she noticed afterwards that she had a ruptured vessel in the sclera. By the time the patient arrived to the Emergency Room, all the symptoms had resolved. Denied any chest pain, shortness of breath, weakness, fatigue, nausea, or vomiting. The patient was sitting at the time of the episode and did not notice a change in her gait, no  weakness. Due to the nature of his symptoms, she was evaluated in the Emergency Room for possible TIA evaluation.   REVIEW OF SYSTEMS:  CONSTITUTIONAL: Denies blackout spells or syncope. Denies nausea, vomiting. Denies fever, chills, sweats, weight loss, weight gain.  GASTROINTESTINAL: No hemoptysis or hematemesis.  GENITOURINARY: Denies bright red blood per rectum.  EYES: She had some mine mild vision changes, which improved.  NEUROLOGICAL: The patient has had some mild overall dysphagia, which improved over time.   PAST MEDICAL HISTORY: Hypertension, gastroesophageal reflux, glaucoma, breast cancer, history of chemotherapy.   PAST  SURGICAL HISTORY: Right mastectomy, meniscal tear, knee surgery, cataract surgery.   SOCIAL HISTORY: Occasionally drinks wine. No smoking. No alcohol consumption. Retired.   FAMILY HISTORY: Pancreatic cancer, congestive heart failure, heart disease.   MEDICATIONS: Benazepril, amlodipine.   ALLERGIES: HYDROCODONE.   LABORATORY DATA: Glucose 112, BUN 22, creatinine 1.25, sodium 143, potassium 3.4, chloride 107, bicarbonate 29, calcium of 8.9, bilirubin 3.6, alkaline phosphatase 76. LFTs are otherwise negative. Troponin negative. White count of 7.4, hemoglobin 11.6, hematocrit 34.7, platelet count of 229, MCV 101. CT of the head was negative, chronic atrophy, small vessel changes.   PHYSICAL EXAMINATION: VITAL SIGNS: Blood pressure was 160/80, pulse of 75, respiratory rate 16, afebrile.  HEENT: Normocephalic, atraumatic. Pupils equal and reactive to light.  NECK: Supple. No significant JVD, bruits, or adenopathy.  LUNGS: Clear to auscultation and percussion. No significant  TEXT>>, rhonchi, or rale.  HEART: Regular rate and rhythm. Positive bowel sounds. No rebound, guarding, or tenderness.  EXTREMITIES: Within normal limits.  NEUROLOGIC: Grossly intact.  SKIN: Normal.   ASSESSMENT: Transient ischemic attack, hypertension, reflux, mild obesity, history of breast cancer.   PLAN:  1. Agree with admit. Consider MRI for evaluation of the brain for TIA.  2. Consider treatment with aspirin and/or Plavix.  3. Recommend neurology evaluation.  4. Continue stricter blood pressure control.  5. Echocardiogram will be helpful for evaluation of source of thrombus.  6. Probably will need a functional study for evaluation of coronary artery disease. This can be done as an outpatient.  7. Continue reflux therapy.  8. Recommend weight loss, exercise, portion control.  ____________________________ Loran Senters Clayborn Bigness, MD ddc:mw D: 05/18/2014 17:22:53 ET T: 05/18/2014 18:50:01  ET JOB#: [id number]  cc: Shyia Fillingim D. Clayborn Bigness, MD, <Dictator> Yolonda Kida MD ELECTRONICALLY SIGNED 06/03/2014 11:46

## 2014-12-09 DIAGNOSIS — H4011X3 Primary open-angle glaucoma, severe stage: Secondary | ICD-10-CM | POA: Diagnosis not present

## 2014-12-14 DIAGNOSIS — K219 Gastro-esophageal reflux disease without esophagitis: Secondary | ICD-10-CM | POA: Diagnosis not present

## 2014-12-14 DIAGNOSIS — G459 Transient cerebral ischemic attack, unspecified: Secondary | ICD-10-CM | POA: Diagnosis not present

## 2014-12-14 DIAGNOSIS — E669 Obesity, unspecified: Secondary | ICD-10-CM | POA: Diagnosis not present

## 2014-12-14 DIAGNOSIS — I10 Essential (primary) hypertension: Secondary | ICD-10-CM | POA: Diagnosis not present

## 2015-01-12 DIAGNOSIS — H5711 Ocular pain, right eye: Secondary | ICD-10-CM | POA: Diagnosis not present

## 2015-01-12 DIAGNOSIS — T1501XA Foreign body in cornea, right eye, initial encounter: Secondary | ICD-10-CM | POA: Diagnosis not present

## 2015-03-01 DIAGNOSIS — Z23 Encounter for immunization: Secondary | ICD-10-CM | POA: Diagnosis not present

## 2015-04-18 DIAGNOSIS — H401133 Primary open-angle glaucoma, bilateral, severe stage: Secondary | ICD-10-CM | POA: Diagnosis not present

## 2015-05-12 ENCOUNTER — Encounter: Payer: Self-pay | Admitting: *Deleted

## 2015-06-02 ENCOUNTER — Other Ambulatory Visit: Payer: Self-pay

## 2015-06-02 DIAGNOSIS — Z1239 Encounter for other screening for malignant neoplasm of breast: Secondary | ICD-10-CM

## 2015-06-02 DIAGNOSIS — Z1231 Encounter for screening mammogram for malignant neoplasm of breast: Secondary | ICD-10-CM

## 2015-06-02 DIAGNOSIS — Z9011 Acquired absence of right breast and nipple: Secondary | ICD-10-CM

## 2015-06-11 DIAGNOSIS — L0292 Furuncle, unspecified: Secondary | ICD-10-CM | POA: Diagnosis not present

## 2015-06-11 DIAGNOSIS — N760 Acute vaginitis: Secondary | ICD-10-CM | POA: Diagnosis not present

## 2015-06-17 ENCOUNTER — Ambulatory Visit: Payer: No Typology Code available for payment source | Admitting: Family Medicine

## 2015-06-20 ENCOUNTER — Encounter: Payer: Self-pay | Admitting: Family Medicine

## 2015-06-20 ENCOUNTER — Ambulatory Visit (INDEPENDENT_AMBULATORY_CARE_PROVIDER_SITE_OTHER): Payer: Medicare Other | Admitting: Family Medicine

## 2015-06-20 VITALS — BP 136/78 | HR 79 | Temp 97.8°F | Ht 66.5 in | Wt 205.5 lb

## 2015-06-20 DIAGNOSIS — I10 Essential (primary) hypertension: Secondary | ICD-10-CM | POA: Diagnosis not present

## 2015-06-20 DIAGNOSIS — N907 Vulvar cyst: Secondary | ICD-10-CM | POA: Diagnosis not present

## 2015-06-20 DIAGNOSIS — E785 Hyperlipidemia, unspecified: Secondary | ICD-10-CM

## 2015-06-20 DIAGNOSIS — E669 Obesity, unspecified: Secondary | ICD-10-CM | POA: Diagnosis not present

## 2015-06-20 DIAGNOSIS — Z23 Encounter for immunization: Secondary | ICD-10-CM

## 2015-06-20 DIAGNOSIS — R7309 Other abnormal glucose: Secondary | ICD-10-CM

## 2015-06-20 DIAGNOSIS — Z1159 Encounter for screening for other viral diseases: Secondary | ICD-10-CM | POA: Diagnosis not present

## 2015-06-20 LAB — COMPREHENSIVE METABOLIC PANEL
ALT: 22 U/L (ref 0–35)
AST: 26 U/L (ref 0–37)
Albumin: 4.1 g/dL (ref 3.5–5.2)
Alkaline Phosphatase: 51 U/L (ref 39–117)
BILIRUBIN TOTAL: 0.4 mg/dL (ref 0.2–1.2)
BUN: 28 mg/dL — ABNORMAL HIGH (ref 6–23)
CHLORIDE: 103 meq/L (ref 96–112)
CO2: 29 mEq/L (ref 19–32)
Calcium: 9.9 mg/dL (ref 8.4–10.5)
Creatinine, Ser: 1.11 mg/dL (ref 0.40–1.20)
GFR: 62.24 mL/min (ref >60.00)
GLUCOSE: 105 mg/dL — AB (ref 70–99)
POTASSIUM: 4.4 meq/L (ref 3.5–5.1)
SODIUM: 140 meq/L (ref 135–145)
TOTAL PROTEIN: 7.4 g/dL (ref 6.0–8.3)

## 2015-06-20 LAB — CBC WITH DIFFERENTIAL/PLATELET
Basophils Absolute: 0.1 10*3/uL (ref 0.0–0.1)
Basophils Relative: 1 % (ref 0.0–3.0)
Eosinophils Absolute: 0.1 10*3/uL (ref 0.0–0.7)
Eosinophils Relative: 2.2 % (ref 0.0–5.0)
HCT: 36 % (ref 36.0–46.0)
Hemoglobin: 11.6 g/dL — ABNORMAL LOW (ref 12.0–15.0)
Lymphocytes Relative: 43.8 % (ref 12.0–46.0)
Lymphs Abs: 2.4 10*3/uL (ref 0.7–4.0)
MCHC: 32.4 g/dL (ref 30.0–36.0)
MCV: 100.8 fl — ABNORMAL HIGH (ref 78.0–100.0)
Monocytes Absolute: 0.4 10*3/uL (ref 0.1–1.0)
Monocytes Relative: 7.9 % (ref 3.0–12.0)
Neutro Abs: 2.5 10*3/uL (ref 1.4–7.7)
Neutrophils Relative %: 45.1 % (ref 43.0–77.0)
Platelets: 351 10*3/uL (ref 150.0–400.0)
RBC: 3.57 Mil/uL — ABNORMAL LOW (ref 3.87–5.11)
RDW: 13.8 % (ref 11.5–15.5)
WBC: 5.6 10*3/uL (ref 4.0–10.5)

## 2015-06-20 LAB — LIPID PANEL
CHOL/HDL RATIO: 2
Cholesterol: 167 mg/dL (ref 0–200)
HDL: 69.2 mg/dL (ref >39.00)
LDL Cholesterol: 88 mg/dL (ref 0–99)
NONHDL: 98.06
Triglycerides: 50 mg/dL (ref 0.0–149.0)
VLDL: 10 mg/dL (ref 0.0–40.0)

## 2015-06-20 LAB — TSH: TSH: 0.46 u[IU]/mL (ref 0.35–4.50)

## 2015-06-20 LAB — HEMOGLOBIN A1C: Hgb A1c MFr Bld: 4.9 % (ref 4.6–6.5)

## 2015-06-20 NOTE — Assessment & Plan Note (Signed)
Disc goals for lipids and reasons to control them Rev labs with pt (from last check) Lab today Rev low sat fat diet in detail

## 2015-06-20 NOTE — Assessment & Plan Note (Signed)
A1C today Disc need for wt loss and low glycemic diet to prevent DM

## 2015-06-20 NOTE — Assessment & Plan Note (Addendum)
Seen at [facility] and tx with doxycycline  Much improved Enc to continue warm compress or sitz baths Enc to stop shaving pubic region

## 2015-06-20 NOTE — Assessment & Plan Note (Addendum)
Discussed how this problem influences overall health and the risks it imposes  Reviewed plan for weight loss with lower calorie diet (via better food choices and also portion control or program like weight watchers) and exercise building up to or more than 30 minutes 5 days per week including some aerobic activity    

## 2015-06-20 NOTE — Progress Notes (Signed)
Pre visit review using our clinic review tool, if applicable. No additional management support is needed unless otherwise documented below in the visit note. 

## 2015-06-20 NOTE — Assessment & Plan Note (Signed)
Added to labs at pt's request Not high risk

## 2015-06-20 NOTE — Patient Instructions (Addendum)
If you are interested in a shingles/zoster vaccine - call your insurance to check on coverage,( you should not get it within 1 month of other vaccines) , then call us for a prescription  for it to take to a pharmacy that gives the shot , or make a nurse visit to get it here depending on your coverage prevnar vaccine today Lab today get your mammogram as planned   Continue warm compresses on the cyst  Update if the cyst gets worse  Keep clean with soap and water

## 2015-06-20 NOTE — Progress Notes (Signed)
Subjective:    Patient ID: Michele Bryant, female    DOB: [birth year], 70 y.o.   MRN: [id number]  HPI Here for rash and lab work for chronic health problems   Went to UC last sat a week ago  Thought it was an ingrown hair  Getting better - drained / no longer any liquid  Itches a bit  Treated with doxycycline  Also given flagyl for BV and she is still taking that (after sending off a wet prep)  bp is stable today  No cp or palpitations or headaches or edema  No side effects to medicines  BP Readings from Last 3 Encounters:  06/20/15 136/78  05/31/14 136/74  06/19/13 132/82      Wt is stable  bmi 32   Started back exercise- joined the gym  Was off routine = mother had falls and she had to take care of her   Patient Active Problem List   Diagnosis Date Noted  . Need for hepatitis C screening test 06/20/2015  . Vulvar cyst 06/20/2015  . TIA (transient ischemic attack) 05/31/2014  . Low back pain 06/21/2013  . Encounter for Medicare annual wellness exam 05/25/2013  . GERD (gastroesophageal reflux disease) 04/07/2013  . Abdominal pain 01/25/2012  . Family history of pancreatic cancer 07/01/2011  . Low TSH level 06/29/2011  . Family history of osteoporosis 06/12/2011  . Post-menopausal 06/12/2011  . 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 TIBIALIS TENDINITIS 06/30/2009  . ACROMIOCLAVICULAR JOINT SEPARATION, RIGHT 06/30/2009  . FASTING HYPERGLYCEMIA 08/30/2008  . OBESITY 05/18/2008  . Hyperlipidemia 09/03/2007  . TOBACCO USE, QUIT 09/03/2007  . GLAUCOMA 11/26/2006  . Essential hypertension 11/26/2006  . CARDIOMYOPATHY 11/26/2006  . ALLERGIC RHINITIS 11/26/2006  . BREAST CANCER, HX OF 11/26/2006   Past Medical History  Diagnosis Date  . Arthritis   . Cancer (HCC)     breast  . Chicken pox   . Hypertension   . Measles   . Allergy     allergic rhinitis  . Hyperlipidemia   . Obesity   . Cataract     mild  . Lactose intolerance   .  Hyperglycemia   . Allergic rhinitis    Past Surgical History  Procedure Laterality Date  . Mastectomy    . Knee surgery    . Hand surgery      tendon   Social History  Substance Use Topics  . Smoking status: Former Smoker    Types: Cigarettes    Quit date: 05/14/2004  . Smokeless tobacco: Never Used  . Alcohol Use: 0.0 oz/week    0 Standard drinks or equivalent per week     Comment: occasional   Family History  Problem Relation Age of Onset  . Heart disease Father     CHF  . Heart failure Father   . Hypertension Father   . Cancer Sister     pancreatic CA  . Heart disease Brother     MI  . Pancreatic cancer Brother    Allergies  Allergen Reactions  . Hydrocodone Itching   Current Outpatient Prescriptions on File Prior to Visit  Medication Sig Dispense Refill  . amLODipine (NORVASC) 5 MG tablet TAKE 1 TABLET BY MOUTH EVERY DAY 90 tablet 3  . benazepril-hydrochlorthiazide (LOTENSIN HCT) 20-12.5 MG per tablet Take 2 tablets by mouth daily 180 tablet 3  . brimonidine (ALPHAGAN P) 0.1 % SOLN Place 1 drop into both eyes every 8 (eight) hours.      Marland Kitchen  cholecalciferol (VITAMIN D) 1000 UNITS tablet Take 2,000-3,000 Units by mouth daily.    . Coenzyme Q10 (COQ10) 100 MG CAPS Take 1 tablet by mouth daily.    Marland Kitchen omeprazole (PRILOSEC) 20 MG capsule Take 1 capsule (20 mg total) by mouth daily. (Patient taking differently: Take 20 mg by mouth daily as needed. ) 90 capsule 3   No current facility-administered medications on file prior to visit.    Review of Systems Review of Systems  Constitutional: Negative for fever, appetite change, fatigue and unexpected weight change.  Eyes: Negative for pain and visual disturbance.  Respiratory: Negative for cough and shortness of breath.   Cardiovascular: Negative for cp or palpitations    Gastrointestinal: Negative for nausea, diarrhea and constipation.  Genitourinary: Negative for urgency and frequency.  Skin: Negative for pallor or rash   pos for vulvar cyst that is improved  Neurological: Negative for weakness, light-headedness, numbness and headaches.  Hematological: Negative for adenopathy. Does not bruise/bleed easily.  Psychiatric/Behavioral: Negative for dysphoric mood. The patient is not nervous/anxious.         Objective:   Physical Exam  Constitutional: She appears well-developed and well-nourished. No distress.  obese and well appearing   HENT:  Head: Normocephalic and atraumatic.  Mouth/Throat: Oropharynx is clear and moist.  Eyes: Conjunctivae and EOM are normal. Pupils are equal, round, and reactive to light.  Neck: Normal range of motion. Neck supple. No JVD present. Carotid bruit is not present. No thyromegaly present.  Cardiovascular: Normal rate, regular rhythm, normal heart sounds and intact distal pulses.  Exam reveals no gallop.   Pulmonary/Chest: Effort normal and breath sounds normal. No respiratory distress. She has no wheezes. She has no rales.  No crackles  Abdominal: Soft. Bowel sounds are normal. She exhibits no distension, no abdominal bruit and no mass. There is no tenderness.  Genitourinary:  Cyst - less than 1 cm noted on L mid labia minora  No erythema or tenderness Not fluctuant  Mobile No longer draining   Musculoskeletal: She exhibits no edema.  Lymphadenopathy:    She has no cervical adenopathy.  Neurological: She is alert. She has normal reflexes.  Skin: Skin is warm and dry. No rash noted.  Psychiatric: She has a normal mood and affect.          Assessment & Plan:   Problem List Items Addressed This Visit      Cardiovascular and Mediastinum   Essential hypertension - Primary    bp in fair control at this time  BP Readings from Last 1 Encounters:  06/20/15 136/78   No changes needed Disc lifstyle change with low sodium diet and exercise  Lab today      Relevant Medications   aspirin 325 MG tablet   Other Relevant Orders   CBC with Differential/Platelet  (Completed)   Comprehensive metabolic panel (Completed)   TSH (Completed)   Lipid panel (Completed)     Genitourinary   Vulvar cyst    Seen at [facility] and tx with doxycycline  Much improved Enc to continue warm compress or sitz baths Enc to stop shaving pubic region         Other   FASTING HYPERGLYCEMIA    A1C today Disc need for wt loss and low glycemic diet to prevent DM      Relevant Orders   Hemoglobin A1c (Completed)   Hyperlipidemia    Disc goals for lipids and reasons to control them Rev labs with pt (from last  check) Lab today Rev low sat fat diet in detail       Relevant Medications   aspirin 325 MG tablet   Other Relevant Orders   Lipid panel (Completed)   Need for hepatitis C screening test    Added to labs at pt's request Not high risk      Relevant Orders   Hepatitis C antibody   OBESITY    Discussed how this problem influences overall health and the risks it imposes  Reviewed plan for weight loss with lower calorie diet (via better food choices and also portion control or program like weight watchers) and exercise building up to or more than 30 minutes 5 days per week including some aerobic activity   .       Other Visit Diagnoses    Need for vaccination with 13-polyvalent pneumococcal conjugate vaccine        Relevant Orders    Pneumococcal conjugate vaccine 13-valent (Completed)

## 2015-06-20 NOTE — Assessment & Plan Note (Signed)
bp in fair control at this time  BP Readings from Last 1 Encounters:  06/20/15 136/78   No changes needed Disc lifstyle change with low sodium diet and exercise  Lab today

## 2015-06-21 ENCOUNTER — Encounter: Payer: Self-pay | Admitting: *Deleted

## 2015-06-21 LAB — HEPATITIS C ANTIBODY: HCV AB: NEGATIVE

## 2015-06-24 ENCOUNTER — Ambulatory Visit: Payer: No Typology Code available for payment source

## 2015-06-24 ENCOUNTER — Ambulatory Visit
Admission: RE | Admit: 2015-06-24 | Discharge: 2015-06-24 | Disposition: A | Discharge status: Home / Self Care | Payer: Medicare Other | Source: Ambulatory Visit

## 2015-06-24 DIAGNOSIS — Z1231 Encounter for screening mammogram for malignant neoplasm of breast: Secondary | ICD-10-CM | POA: Diagnosis not present

## 2015-06-24 DIAGNOSIS — Z9011 Acquired absence of right breast and nipple: Secondary | ICD-10-CM

## 2015-06-24 LAB — HM MAMMOGRAPHY: HM MAMMO: NORMAL

## 2015-06-29 ENCOUNTER — Encounter: Payer: Self-pay | Admitting: *Deleted

## 2015-06-29 ENCOUNTER — Encounter: Payer: Self-pay | Admitting: Family Medicine

## 2015-08-30 ENCOUNTER — Emergency Department (HOSPITAL_COMMUNITY)
Admission: EM | Admit: 2015-08-30 | Discharge: 2015-08-30 | Disposition: A | Discharge status: Home / Self Care | Payer: No Typology Code available for payment source | Attending: Emergency Medicine | Admitting: Emergency Medicine

## 2015-08-30 ENCOUNTER — Emergency Department (HOSPITAL_COMMUNITY): Payer: No Typology Code available for payment source

## 2015-08-30 ENCOUNTER — Encounter (HOSPITAL_COMMUNITY): Payer: Self-pay | Admitting: Emergency Medicine

## 2015-08-30 DIAGNOSIS — Z79899 Other long term (current) drug therapy: Secondary | ICD-10-CM | POA: Insufficient documentation

## 2015-08-30 DIAGNOSIS — Y92410 Unspecified street and highway as the place of occurrence of the external cause: Secondary | ICD-10-CM | POA: Insufficient documentation

## 2015-08-30 DIAGNOSIS — S3993XA Unspecified injury of pelvis, initial encounter: Secondary | ICD-10-CM | POA: Diagnosis not present

## 2015-08-30 DIAGNOSIS — M542 Cervicalgia: Secondary | ICD-10-CM | POA: Diagnosis not present

## 2015-08-30 DIAGNOSIS — E669 Obesity, unspecified: Secondary | ICD-10-CM | POA: Insufficient documentation

## 2015-08-30 DIAGNOSIS — R0602 Shortness of breath: Secondary | ICD-10-CM | POA: Diagnosis not present

## 2015-08-30 DIAGNOSIS — M25511 Pain in right shoulder: Secondary | ICD-10-CM | POA: Diagnosis not present

## 2015-08-30 DIAGNOSIS — Z87891 Personal history of nicotine dependence: Secondary | ICD-10-CM | POA: Insufficient documentation

## 2015-08-30 DIAGNOSIS — Y9389 Activity, other specified: Secondary | ICD-10-CM | POA: Insufficient documentation

## 2015-08-30 DIAGNOSIS — T148 Other injury of unspecified body region: Secondary | ICD-10-CM | POA: Diagnosis not present

## 2015-08-30 DIAGNOSIS — I10 Essential (primary) hypertension: Secondary | ICD-10-CM | POA: Diagnosis not present

## 2015-08-30 DIAGNOSIS — S4991XA Unspecified injury of right shoulder and upper arm, initial encounter: Secondary | ICD-10-CM | POA: Diagnosis not present

## 2015-08-30 DIAGNOSIS — Z8709 Personal history of other diseases of the respiratory system: Secondary | ICD-10-CM | POA: Diagnosis not present

## 2015-08-30 DIAGNOSIS — M1990 Unspecified osteoarthritis, unspecified site: Secondary | ICD-10-CM | POA: Insufficient documentation

## 2015-08-30 DIAGNOSIS — Z8619 Personal history of other infectious and parasitic diseases: Secondary | ICD-10-CM | POA: Diagnosis not present

## 2015-08-30 DIAGNOSIS — E785 Hyperlipidemia, unspecified: Secondary | ICD-10-CM | POA: Insufficient documentation

## 2015-08-30 DIAGNOSIS — S8991XA Unspecified injury of right lower leg, initial encounter: Secondary | ICD-10-CM | POA: Insufficient documentation

## 2015-08-30 DIAGNOSIS — Z8669 Personal history of other diseases of the nervous system and sense organs: Secondary | ICD-10-CM | POA: Insufficient documentation

## 2015-08-30 DIAGNOSIS — S4992XA Unspecified injury of left shoulder and upper arm, initial encounter: Secondary | ICD-10-CM | POA: Insufficient documentation

## 2015-08-30 DIAGNOSIS — Z7982 Long term (current) use of aspirin: Secondary | ICD-10-CM | POA: Insufficient documentation

## 2015-08-30 DIAGNOSIS — M25512 Pain in left shoulder: Secondary | ICD-10-CM | POA: Diagnosis not present

## 2015-08-30 DIAGNOSIS — M79622 Pain in left upper arm: Secondary | ICD-10-CM | POA: Diagnosis not present

## 2015-08-30 DIAGNOSIS — M25561 Pain in right knee: Secondary | ICD-10-CM | POA: Diagnosis not present

## 2015-08-30 DIAGNOSIS — M79621 Pain in right upper arm: Secondary | ICD-10-CM | POA: Diagnosis not present

## 2015-08-30 DIAGNOSIS — Z853 Personal history of malignant neoplasm of breast: Secondary | ICD-10-CM | POA: Insufficient documentation

## 2015-08-30 DIAGNOSIS — S199XXA Unspecified injury of neck, initial encounter: Secondary | ICD-10-CM | POA: Diagnosis not present

## 2015-08-30 DIAGNOSIS — Y998 Other external cause status: Secondary | ICD-10-CM | POA: Diagnosis not present

## 2015-08-30 MED ORDER — METHOCARBAMOL 1000 MG/10ML IJ SOLN
500.0000 mg | Freq: Once | INTRAMUSCULAR | Status: DC
  Filled 2015-08-30: qty 5

## 2015-08-30 MED ORDER — METHOCARBAMOL 500 MG PO TABS
500.0000 mg | ORAL_TABLET | Freq: Once | ORAL | Status: AC
  Administered 2015-08-30: 500 mg via ORAL
  Filled 2015-08-30: qty 1

## 2015-08-30 MED ORDER — KETOROLAC TROMETHAMINE 60 MG/2ML IM SOLN
30.0000 mg | Freq: Once | INTRAMUSCULAR | Status: AC
  Administered 2015-08-30: 30 mg via INTRAMUSCULAR
  Filled 2015-08-30: qty 2

## 2015-08-30 MED ORDER — IBUPROFEN 400 MG PO TABS: 400.0000 mg | ORAL_TABLET | Freq: Four times a day (QID) | ORAL | Status: DC | PRN

## 2015-08-30 MED ORDER — METHOCARBAMOL 500 MG PO TABS: 500.0000 mg | ORAL_TABLET | Freq: Three times a day (TID) | ORAL | Status: AC | PRN
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
## 2015-08-30 NOTE — ED Notes (Signed)
PT DISCHARGED. INSTRUCTIONS AND PRESCRIPTIONS GIVEN. AAOX3. PT IN NO APPARENT DISTRESS. THE OPPORTUNITY TO ASK QUESTIONS WAS PROVIDED. 

## 2015-08-30 NOTE — ED Notes (Signed)
Ambulated to bathroom with minimal assistance.
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
## 2015-09-05 ENCOUNTER — Ambulatory Visit (INDEPENDENT_AMBULATORY_CARE_PROVIDER_SITE_OTHER): Payer: Medicare Other

## 2015-09-05 VITALS — BP 150/82 | HR 66 | Temp 98.5°F | Ht 67.0 in | Wt 208.5 lb

## 2015-09-05 DIAGNOSIS — Z0000 Encounter for general adult medical examination without abnormal findings: Secondary | ICD-10-CM

## 2015-09-05 NOTE — Progress Notes (Signed)
Pre visit review using our clinic review tool, if applicable. No additional management support is needed unless otherwise documented below in the visit note. 
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
## 2015-09-05 NOTE — Patient Instructions (Signed)
Ms. Trottier , Thank you for taking time to come for your Medicare Wellness Visit. I appreciate your ongoing commitment to your health goals. Please review the following plan we discussed and let me know if I can assist you in the future.   These are the goals we discussed: Goals    . diet and exercise     Starting 09/05/2015, I would like to resume healthier eating habits and increasing exercise.        This is a list of the screening recommended for you and due dates:  Health Maintenance  Topic Date Due  . Shingles Vaccine  09/04/2016*  . Cologuard (Stool DNA test)  09/04/2016*  . Flu Shot  12/13/2015  . Tetanus Vaccine  05/29/2016  . Mammogram  06/23/2016  . DEXA scan (bone density measurement)  Completed  .  Hepatitis C: One time screening is recommended by Center for Disease Control  (CDC) for  adults born from [birth year] through 1967.   Completed  . Pneumonia vaccines  Completed  *Topic was postponed. The date shown is not the original due date.   Preventive Care for Adults  A healthy lifestyle and preventive care can promote health and wellness. Preventive health guidelines for adults include the following key practices.  . A routine yearly physical is a good way to check with your health care provider about your health and preventive screening. It is a chance to share any concerns and updates on your health and to receive a thorough exam.  . Visit your dentist for a routine exam and preventive care every 6 months. Brush your teeth twice a day and floss once a day. Good oral hygiene prevents tooth decay and gum disease.  . The frequency of eye exams is based on your age, health, family medical history, use  of contact lenses, and other factors. Follow your health care provider's ecommendations for frequency of eye exams.  . Eat a healthy diet. Foods like vegetables, fruits, whole grains, low-fat dairy products, and lean protein foods contain the nutrients you need without too many  calories. Decrease your intake of foods high in solid fats, added sugars, and salt. Eat the right amount of calories for you. Get information about a proper diet from your health care provider, if necessary.  . Regular physical exercise is one of the most important things you can do for your health. Most adults should get at least 150 minutes of moderate-intensity exercise (any activity that increases your heart rate and causes you to sweat) each week. In addition, most adults need muscle-strengthening exercises on 2 or more days a week.  Silver Sneakers may be a benefit available to you. To determine eligibility, you may visit the website: www.silversneakers.com or contact program at (867)346-7786 Mon-Fri between 8AM-8PM.   . Maintain a healthy weight. The body mass index (BMI) is a screening tool to identify possible weight problems. It provides an estimate of body fat based on height and weight. Your health care provider can find your BMI and can help you achieve or maintain a healthy weight.   For adults 20 years and older: ? A BMI below 18.5 is considered underweight. ? A BMI of 18.5 to 24.9 is normal. ? A BMI of 25 to 29.9 is considered overweight. ? A BMI of 30 and above is considered obese.   . Maintain normal blood lipids and cholesterol levels by exercising and minimizing your intake of saturated fat. Eat a balanced diet with plenty of fruit  and vegetables. Blood tests for lipids and cholesterol should begin at age 53 and be repeated every 5 years. If your lipid or cholesterol levels are high, you are over 50, or you are at high risk for heart disease, you may need your cholesterol levels checked more frequently. Ongoing high lipid and cholesterol levels should be treated with medicines if diet and exercise are not working.  . If you smoke, find out from your health care provider how to quit. If you do not use tobacco, please do not start.  . If you choose to drink alcohol, please do  not consume more than 2 drinks per day. One drink is considered to be 12 ounces (355 mL) of beer, 5 ounces (148 mL) of wine, or 1.5 ounces (44 mL) of liquor.  . If you are 37-36 years old, ask your health care provider if you should take aspirin to prevent strokes.  . Use sunscreen. Apply sunscreen liberally and repeatedly throughout the day. You should seek shade when your shadow is shorter than you. Protect yourself by wearing long sleeves, pants, a wide-brimmed hat, and sunglasses year round, whenever you are outdoors.  . Once a month, do a whole body skin exam, using a mirror to look at the skin on your back. Tell your health care provider of new moles, moles that have irregular borders, moles that are larger than a pencil eraser, or moles that have changed in shape or color.

## 2015-09-05 NOTE — Progress Notes (Signed)
Subjective:   Michele Bryant is a 70 y.o. female who presents for Medicare Annual (Subsequent) preventive examination.   Cardiac Risk Factors include: advanced age (>74men, >77 women);dyslipidemia;obesity (BMI >30kg/m2)     Objective:     Vitals: BP 150/82 mmHg  Pulse 66  Temp(Src) 98.5 F (36.9 C) (Oral)  Ht 5\' 7"  (1.702 m)  Wt 208 lb 8 oz (94.575 kg)  BMI 32.65 kg/m2  SpO2 98%  Body mass index is 32.65 kg/(m^2).   Tobacco History  Smoking status  . Former Smoker  . Types: Cigarettes  . Quit date: 05/14/2004  Smokeless tobacco  . Never Used     Counseling given: No   Past Medical History  Diagnosis Date  . Arthritis   . Cancer (HCC)     breast  . Chicken pox   . Hypertension   . Measles   . Allergy     allergic rhinitis  . Hyperlipidemia   . Obesity   . Cataract     mild  . Lactose intolerance   . Hyperglycemia   . Allergic rhinitis    Past Surgical History  Procedure Laterality Date  . Mastectomy    . Knee surgery    . Hand surgery      tendon   Family History  Problem Relation Age of Onset  . Heart disease Father     CHF  . Heart failure Father   . Hypertension Father   . Cancer Sister     pancreatic CA  . Heart disease Brother     MI  . Pancreatic cancer Brother    History  Sexual Activity  . Sexual Activity: Yes    Outpatient Encounter Prescriptions as of 09/05/2015  Medication Sig  . amLODipine (NORVASC) 5 MG tablet TAKE 1 TABLET BY MOUTH EVERY DAY  . aspirin 325 MG tablet Take 325 mg by mouth daily.  . benazepril-hydrochlorthiazide (LOTENSIN HCT) 20-12.5 MG per tablet Take 2 tablets by mouth daily  . brimonidine (ALPHAGAN) 0.2 % ophthalmic solution Place 1 drop into both eyes 2 (two) times daily.  . cholecalciferol (VITAMIN D) 1000 UNITS tablet Take 2,000-3,000 Units by mouth daily.  . Coenzyme Q10 (COQ10) 100 MG CAPS Take 1 tablet by mouth daily.  Marland Kitchen ibuprofen (ADVIL,MOTRIN) 400 MG tablet Take 1 tablet (400 mg total) by  mouth every 6 (six) hours as needed.  . methocarbamol (ROBAXIN) 500 MG tablet Take 1 tablet (500 mg total) by mouth every 8 (eight) hours as needed for muscle spasms.  . Omega 3 1000 MG CAPS Take 2,000 mg by mouth daily.  Marland Kitchen omeprazole (PRILOSEC) 20 MG capsule Take 1 capsule (20 mg total) by mouth daily. (Patient taking differently: Take 20 mg by mouth daily as needed. )   No facility-administered encounter medications on file as of 09/05/2015.    Activities of Daily Living In your present state of health, do you have any difficulty performing the following activities: 09/05/2015  Hearing? N  Vision? N  Difficulty concentrating or making decisions? N  Walking or climbing stairs? N  Dressing or bathing? N  Doing errands, shopping? N  Preparing Food and eating ? N  Using the Toilet? N  In the past six months, have you accidently leaked urine? N  Do you have problems with loss of bowel control? N  Managing your Medications? N  Managing your Finances? N  Housekeeping or managing your Housekeeping? N    Patient Care Team: Abner Greenspan, MD  as PCP - General    Assessment:     Hearing Screening   125Hz  250Hz  500Hz  1000Hz  2000Hz  4000Hz  8000Hz   Right ear:   40 40 40 0   Left ear:   40 0 40 0   Vision Screening Comments: Last eye exam in 04/2015   Exercise Activities and Dietary recommendations Current Exercise Habits: Home exercise routine, Type of exercise: strength training/weights;Other - see comments (cardio), Time (Minutes): 30, Frequency (Times/Week): 3, Weekly Exercise (Minutes/Week): 90, Intensity: Moderate, Exercise limited by: None identified  Goals    . diet and exercise     Starting 09/05/2015, I would like to resume healthier eating habits and increasing exercise.       Fall Risk Fall Risk  09/05/2015 05/25/2013  Falls in the past year? No No   Depression Screen PHQ 2/9 Scores 09/05/2015 05/25/2013  PHQ - 2 Score 0 0     Cognitive Testing MMSE - Mini Mental State  Exam 09/05/2015  Orientation to time 5  Orientation to Place 5  Registration 3  Attention/ Calculation 0  Recall 3  Language- name 2 objects 0  Language- repeat 1  Language- follow 3 step command 3  Language- read & follow direction 0  Write a sentence 0  Copy design 0  Total score 20   PLEASE NOTE: A Mini-Cog screen was completed. Maximum score is 20. A value of 0 denotes this part of [place] MMSE was not completed.  Orientation to Time - Max 5 Orientation to Place - Max 5 Registration - Max 3 Recall - Max 3 Language Repeat - Max 1 Language Follow 3 Step Command - Max 3   Immunization History  Administered Date(s) Administered  . Influenza Split 04/18/2011, 01/25/2012  . Influenza Whole 06/16/2009, 02/21/2010  . Influenza,inj,Quad PF,36+ Mos 04/07/2013, 01/15/2014  . Influenza-Unspecified 02/26/2015  . Pneumococcal Conjugate-13 06/20/2015  . Pneumococcal Polysaccharide-23 06/22/2009  . Td 05/29/2006   Screening Tests Health Maintenance  Topic Date Due  . ZOSTAVAX  09/04/2016 (Originally 01/20/2004)  . Fecal DNA (Cologuard)  09/04/2016 (Originally 01/19/1994)  . INFLUENZA VACCINE  12/13/2015  . TETANUS/TDAP  05/29/2016  . MAMMOGRAM  06/23/2016  . DEXA SCAN  Completed  . Hepatitis C Screening  Completed  . PNA vac Low Risk Adult  Completed      Plan:     I have personally reviewed and addressed the Medicare Annual Wellness questionnaire and have noted the following in the patient's chart:  A. Medical and social history B. Use of alcohol, tobacco or illicit drugs  C. Current medications and supplements D. Functional ability and status E.  Nutritional status F.  Physical activity G. Advance directives H. List of other physicians I.  Hospitalizations, surgeries, and ER visits in previous 12 months J.  [place] to include hearing, vision, cognitive, depression L. Referrals and appointments - none  In addition, I have reviewed and discussed with  patient certain preventive protocols, quality metrics, and best practice recommendations. A written personalized care plan for preventive services as well as general preventive health recommendations were provided to patient.  See attached scanned questionnaire for additional information.   Signed,   Lindell Noe, MHA, BS, LPN Health Advisor [phone]

## 2015-09-07 NOTE — Progress Notes (Signed)
   Subjective:    Patient ID: Michele Bryant, female    DOB: [birth year], 70 y.o.   MRN: [id number]  HPI    Review of Systems     Objective:   Physical Exam        Assessment & Plan:  I reviewed health advisor's note, was available for consultation, and agree with documentation and plan.

## 2015-09-08 ENCOUNTER — Encounter: Payer: Self-pay | Admitting: Family Medicine

## 2015-09-08 ENCOUNTER — Ambulatory Visit (INDEPENDENT_AMBULATORY_CARE_PROVIDER_SITE_OTHER): Payer: Medicare Other | Admitting: Family Medicine

## 2015-09-08 ENCOUNTER — Other Ambulatory Visit: Payer: Self-pay | Admitting: Family Medicine

## 2015-09-08 VITALS — BP 132/84 | HR 74 | Temp 97.5°F | Ht 66.5 in | Wt 210.0 lb

## 2015-09-08 DIAGNOSIS — M25511 Pain in right shoulder: Secondary | ICD-10-CM

## 2015-09-08 DIAGNOSIS — M5412 Radiculopathy, cervical region: Secondary | ICD-10-CM | POA: Diagnosis not present

## 2015-09-08 DIAGNOSIS — T1490 Injury, unspecified: Secondary | ICD-10-CM

## 2015-09-08 DIAGNOSIS — R42 Dizziness and giddiness: Secondary | ICD-10-CM

## 2015-09-08 DIAGNOSIS — S46811A Strain of other muscles, fascia and tendons at shoulder and upper arm level, right arm, initial encounter: Secondary | ICD-10-CM

## 2015-09-08 DIAGNOSIS — M25561 Pain in right knee: Secondary | ICD-10-CM | POA: Diagnosis not present

## 2015-09-08 DIAGNOSIS — M542 Cervicalgia: Secondary | ICD-10-CM

## 2015-09-08 MED ORDER — PREDNISONE 20 MG PO TABS: ORAL_TABLET | ORAL | Status: DC
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
## 2015-09-08 NOTE — Progress Notes (Signed)
Pre visit review using our clinic review tool, if applicable. No additional management support is needed unless otherwise documented below in the visit note. 

## 2015-09-09 ENCOUNTER — Telehealth: Payer: Self-pay

## 2015-09-09 NOTE — Telephone Encounter (Signed)
Pt left v/m requesting status of prednisone refill; Anna at [facility] said rx ready for pick up. Left detailed v/m that rx ready for pick up at [place].

## 2015-09-14 DIAGNOSIS — M542 Cervicalgia: Secondary | ICD-10-CM | POA: Diagnosis not present

## 2015-09-15 DIAGNOSIS — Z1212 Encounter for screening for malignant neoplasm of rectum: Secondary | ICD-10-CM | POA: Diagnosis not present

## 2015-09-15 DIAGNOSIS — Z1211 Encounter for screening for malignant neoplasm of colon: Secondary | ICD-10-CM | POA: Diagnosis not present

## 2015-09-15 LAB — COLOGUARD: Cologuard: POSITIVE

## 2015-09-16 ENCOUNTER — Telehealth: Payer: Self-pay | Admitting: *Deleted

## 2015-09-16 DIAGNOSIS — M542 Cervicalgia: Secondary | ICD-10-CM | POA: Diagnosis not present

## 2015-09-16 NOTE — Telephone Encounter (Signed)
PA was done at [email or web address] for Benazepril-HCTZ with directions of BID, PA was approved and pharmacy notified and they will advise pt, Approval letter received and placed in Dr. Marliss Coots inbox for her to sign and send for scanning

## 2015-09-22 DIAGNOSIS — M542 Cervicalgia: Secondary | ICD-10-CM | POA: Diagnosis not present

## 2015-09-26 DIAGNOSIS — M542 Cervicalgia: Secondary | ICD-10-CM | POA: Diagnosis not present

## 2015-09-28 DIAGNOSIS — M542 Cervicalgia: Secondary | ICD-10-CM | POA: Diagnosis not present

## 2015-09-30 ENCOUNTER — Telehealth: Payer: Self-pay | Admitting: *Deleted

## 2015-09-30 DIAGNOSIS — R195 Other fecal abnormalities: Secondary | ICD-10-CM

## 2015-09-30 DIAGNOSIS — Z1211 Encounter for screening for malignant neoplasm of colon: Secondary | ICD-10-CM | POA: Insufficient documentation

## 2015-09-30 NOTE — Telephone Encounter (Signed)
Ref done will route to [facility]  

## 2015-09-30 NOTE — Telephone Encounter (Signed)
Dr. Glori Bickers gave me pt's cologuard results. They were positive, pt advise and she does agree with referral to GI/ colonoscopy, I advise pt our [facility] will call to schedule appt, please put referral in, results sent for scanning and a copy given to [facility]

## 2015-10-05 ENCOUNTER — Ambulatory Visit (INDEPENDENT_AMBULATORY_CARE_PROVIDER_SITE_OTHER): Payer: Medicare Other | Admitting: Family Medicine

## 2015-10-05 ENCOUNTER — Encounter: Payer: Self-pay | Admitting: Family Medicine

## 2015-10-05 ENCOUNTER — Encounter: Payer: Self-pay | Admitting: Gastroenterology

## 2015-10-05 VITALS — BP 122/70 | HR 76 | Temp 98.0°F | Ht 66.5 in | Wt 208.0 lb

## 2015-10-05 DIAGNOSIS — M25561 Pain in right knee: Secondary | ICD-10-CM

## 2015-10-05 DIAGNOSIS — M542 Cervicalgia: Secondary | ICD-10-CM | POA: Diagnosis not present

## 2015-10-05 DIAGNOSIS — M25511 Pain in right shoulder: Secondary | ICD-10-CM

## 2015-10-05 DIAGNOSIS — M5412 Radiculopathy, cervical region: Secondary | ICD-10-CM

## 2015-10-05 DIAGNOSIS — S46811A Strain of other muscles, fascia and tendons at shoulder and upper arm level, right arm, initial encounter: Secondary | ICD-10-CM

## 2015-10-05 DIAGNOSIS — T1490 Injury, unspecified: Secondary | ICD-10-CM

## 2015-10-05 NOTE — Progress Notes (Signed)
Pre visit review using our clinic review tool, if applicable. No additional management support is needed unless otherwise documented below in the visit note. 
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
## 2015-10-06 DIAGNOSIS — M542 Cervicalgia: Secondary | ICD-10-CM | POA: Diagnosis not present

## 2015-10-17 ENCOUNTER — Other Ambulatory Visit: Payer: Self-pay | Admitting: Family Medicine

## 2015-10-17 DIAGNOSIS — M542 Cervicalgia: Secondary | ICD-10-CM | POA: Diagnosis not present

## 2015-10-17 NOTE — Telephone Encounter (Signed)
done

## 2015-10-17 NOTE — Telephone Encounter (Signed)
Please refill times 3 

## 2015-10-19 DIAGNOSIS — M542 Cervicalgia: Secondary | ICD-10-CM | POA: Diagnosis not present

## 2015-11-02 ENCOUNTER — Ambulatory Visit (AMBULATORY_SURGERY_CENTER): Payer: Self-pay | Admitting: *Deleted

## 2015-11-02 VITALS — Ht 67.0 in | Wt 207.0 lb

## 2015-11-02 DIAGNOSIS — I10 Essential (primary) hypertension: Secondary | ICD-10-CM | POA: Insufficient documentation

## 2015-11-02 DIAGNOSIS — Z1211 Encounter for screening for malignant neoplasm of colon: Secondary | ICD-10-CM

## 2015-11-02 MED ORDER — NA SULFATE-K SULFATE-MG SULF 17.5-3.13-1.6 GM/177ML PO SOLN: ORAL | Status: DC

## 2015-11-02 NOTE — Progress Notes (Signed)
Patient denies any allergies to eggs or soy. Patient denies any problems with anesthesia/sedation. Patient denies any oxygen use at home and does not take any diet/weight loss medications.  

## 2015-11-16 ENCOUNTER — Encounter: Payer: Self-pay | Admitting: Gastroenterology

## 2015-11-16 ENCOUNTER — Ambulatory Visit (AMBULATORY_SURGERY_CENTER): Payer: Medicare Other | Admitting: Gastroenterology

## 2015-11-16 VITALS — BP 151/61 | HR 58 | Temp 97.8°F | Resp 13 | Ht 66.0 in | Wt 208.0 lb

## 2015-11-16 DIAGNOSIS — Z1211 Encounter for screening for malignant neoplasm of colon: Secondary | ICD-10-CM | POA: Diagnosis not present

## 2015-11-16 DIAGNOSIS — K219 Gastro-esophageal reflux disease without esophagitis: Secondary | ICD-10-CM | POA: Diagnosis not present

## 2015-11-16 DIAGNOSIS — I10 Essential (primary) hypertension: Secondary | ICD-10-CM | POA: Diagnosis not present

## 2015-11-16 DIAGNOSIS — R195 Other fecal abnormalities: Secondary | ICD-10-CM | POA: Diagnosis present

## 2015-11-16 DIAGNOSIS — D122 Benign neoplasm of ascending colon: Secondary | ICD-10-CM

## 2015-11-16 MED ORDER — SODIUM CHLORIDE 0.9 % IV SOLN: 500.0000 mL | INTRAVENOUS | Status: DC

## 2015-11-16 NOTE — Patient Instructions (Signed)
YOU HAD AN ENDOSCOPIC PROCEDURE TODAY AT THE [facility]:   Refer to the procedure report that was given to you for any specific questions about what was found during the examination.  If the procedure report does not answer your questions, please call your gastroenterologist to clarify.  If you requested that your care partner not be given the details of your procedure findings, then the procedure report has been included in a sealed envelope for you to review at your convenience later.  YOU SHOULD EXPECT: Some feelings of bloating in the abdomen. Passage of more gas than usual.  Walking can help get rid of the air that was put into your GI tract during the procedure and reduce the bloating. If you had a lower endoscopy (such as a colonoscopy or flexible sigmoidoscopy) you may notice spotting of blood in your stool or on the toilet paper. If you underwent a bowel prep for your procedure, you may not have a normal bowel movement for a few days.  Please Note:  You might notice some irritation and congestion in your nose or some drainage.  This is from the oxygen used during your procedure.  There is no need for concern and it should clear up in a day or so.  SYMPTOMS TO REPORT IMMEDIATELY:   Following lower endoscopy (colonoscopy or flexible sigmoidoscopy):  Excessive amounts of blood in the stool  Significant tenderness or worsening of abdominal pains  Swelling of the abdomen that is new, acute  Fever of 100F or higher  For urgent or emergent issues, a gastroenterologist can be reached at any hour by calling [phone].   DIET: Your first meal following the procedure should be a small meal and then it is ok to progress to your normal diet. Heavy or fried foods are harder to digest and may make you feel nauseous or bloated.  Likewise, meals heavy in dairy and vegetables can increase bloating.  Drink plenty of fluids but you should avoid alcoholic beverages for 24  hours.  ACTIVITY:  You should plan to take it easy for the rest of today and you should NOT DRIVE or use heavy machinery until tomorrow (because of the sedation medicines used during the test).    FOLLOW UP: Our staff will call the number listed on your records the next business day following your procedure to check on you and address any questions or concerns that you may have regarding the information given to you following your procedure. If we do not reach you, we will leave a message.  However, if you are feeling well and you are not experiencing any problems, there is no need to return our call.  We will assume that you have returned to your regular daily activities without incident.  If any biopsies were taken you will be contacted by phone or by letter within the next 1-3 weeks.  Please call us at [phone] if you have not heard about the biopsies in 3 weeks.    SIGNATURES/CONFIDENTIALITY: You and/or your care partner have signed paperwork which will be entered into your electronic medical record.  These signatures attest to the fact that that the information above on your After Visit Summary has been reviewed and is understood.  Full responsibility of the confidentiality of this discharge information lies with you and/or your care-partner.    Handouts were given to your care partner on polyps, diverticulosis, and a high fiber diet with liberal fluid intake. You may resume your   current medications today. Await biopsy results. Please call if any questions or concerns.   

## 2015-11-16 NOTE — Op Note (Signed)
[place] Patient Name: Michele Bryant Procedure Date: 11/16/2015 1:26 PM MRN: [id number] Endoscopist: Milus Banister , MD Age: 70 Referring MD:  Date of Birth: 08-Jul-1945 Gender: Female Account #: [id number] Procedure:                Colonoscopy Indications:              Positive Cologuard test Medicines:                Monitored Anesthesia Care Procedure:                Pre-Anesthesia Assessment:                           - Prior to the procedure, a History and Physical                            was performed, and patient medications and                            allergies were reviewed. The patient's tolerance of                            previous anesthesia was also reviewed. The risks                            and benefits of the procedure and the sedation                            options and risks were discussed with the patient.                            All questions were answered, and informed consent                            was obtained. Prior Anticoagulants: The patient has                            taken no previous anticoagulant or antiplatelet                            agents. ASA Grade Assessment: II - A patient with                            mild systemic disease. After reviewing the risks                            and benefits, the patient was deemed in                            satisfactory condition to undergo the procedure.                           After obtaining informed consent, the colonoscope  was passed under direct vision. Throughout the                            procedure, the patient's blood pressure, pulse, and                            oxygen saturations were monitored continuously. The                            Model CF-HQ190L [id number] scope was introduced                            through the anus and advanced to the the cecum,                            identified by appendiceal orifice and  ileocecal                            valve. The colonoscopy was performed without                            difficulty. The patient tolerated the procedure                            well. The quality of the bowel preparation was                            excellent. The ileocecal valve, appendiceal                            orifice, and rectum were photographed. Scope In: 1:35:12 PM Scope Out: 1:44:47 PM Scope Withdrawal Time: 0 hours 6 minutes 14 seconds  Total Procedure Duration: 0 hours 9 minutes 35 seconds  Findings:                 A 6 mm polyp was found in the ascending colon. The                            polyp was sessile. The polyp was removed with a                            cold snare. Resection and retrieval were complete.                           Multiple small and large-mouthed diverticula were                            found in the left colon.                           The exam was otherwise without abnormality on                            direct and retroflexion views. Complications:  No immediate complications. Estimated blood loss:                            None. Estimated Blood Loss:     Estimated blood loss: none. Impression:               - One 6 mm polyp in the ascending colon, removed                            with a cold snare. Resected and retrieved.                           - Diverticulosis in the left colon.                           - The examination was otherwise normal on direct                            and retroflexion views. Recommendation:           - Patient has a contact number available for                            emergencies. The signs and symptoms of potential                            delayed complications were discussed with the                            patient. Return to normal activities tomorrow.                            Written discharge instructions were provided to the                            patient.                            - Resume previous diet.                           - Continue present medications.                           You will receive a letter within 2-3 weeks with the                            pathology results and my final recommendations.                           If the polyp(s) is proven to be 'pre-cancerous' on                            pathology, you will need repeat colonoscopy in 5  years. If the polyp(s) is NOT 'precancerous' on                            pathology then you should repeat colon cancer                            screening in 10 years with colonoscopy without need                            for colon cancer screening by any method prior to                            then (including stool testing). Milus Banister, MD 11/16/2015 1:47:22 PM This report has been signed electronically.

## 2015-11-16 NOTE — Progress Notes (Signed)
No problems noted in the recovery room. maw 

## 2015-11-16 NOTE — Progress Notes (Signed)
Report to PACU, RN, vss, BBS= Clear.  

## 2015-11-16 NOTE — Progress Notes (Signed)
Called to room to assist during endoscopic procedure.  Patient ID and intended procedure confirmed with present staff. Received instructions for my participation in the procedure from the performing physician.  

## 2015-11-17 ENCOUNTER — Telehealth: Payer: Self-pay

## 2015-11-17 NOTE — Telephone Encounter (Signed)
  Follow up Call-  Call back number 11/16/2015  Post procedure Call Back phone  # [phone]  Permission to leave phone message Yes     Patient was called for follow up after her procedure on 11/16/2015. No answer at the number given for follow up phone call. A message was left on the answering machine.

## 2015-11-21 ENCOUNTER — Encounter: Payer: Self-pay | Admitting: Family Medicine

## 2015-11-21 ENCOUNTER — Ambulatory Visit (INDEPENDENT_AMBULATORY_CARE_PROVIDER_SITE_OTHER): Payer: Medicare Other | Admitting: Family Medicine

## 2015-11-21 VITALS — BP 114/78 | HR 68 | Temp 98.3°F | Ht 66.5 in | Wt 206.5 lb

## 2015-11-21 DIAGNOSIS — M25561 Pain in right knee: Secondary | ICD-10-CM

## 2015-11-21 DIAGNOSIS — M25511 Pain in right shoulder: Secondary | ICD-10-CM

## 2015-11-21 DIAGNOSIS — M542 Cervicalgia: Secondary | ICD-10-CM

## 2015-11-21 DIAGNOSIS — T1490 Injury, unspecified: Secondary | ICD-10-CM

## 2015-11-21 DIAGNOSIS — M7051 Other bursitis of knee, right knee: Secondary | ICD-10-CM

## 2015-11-21 DIAGNOSIS — M5412 Radiculopathy, cervical region: Secondary | ICD-10-CM

## 2015-11-21 MED ORDER — METHYLPREDNISOLONE ACETATE 40 MG/ML IJ SUSP
80.0000 mg | Freq: Once | INTRAMUSCULAR | Status: AC
  Administered 2015-11-21: 80 mg via INTRA_ARTICULAR

## 2015-11-21 NOTE — Patient Instructions (Addendum)
Sleeper stretch - twice a day for about a 15 second hold 2 or 3 times.   Posterior Tib and arch rehab Begin with easy walking, heel, toe and backwards * Try to pick an easy location like a hallway or a room in your house and do one of these each time that you go through this area.  Towel "Scrunch Ups" Use a hand towel or a moderate size towel Foot flat down on the towel Use toes to "scrunch up the towel" straight up and down, and going to the right and left.  3 sets of 20 * Can be done watching TV, reading, or sitting and relaxing.

## 2015-11-21 NOTE — Progress Notes (Signed)
Pre visit review using our clinic review tool, if applicable. No additional management support is needed unless otherwise documented below in the visit note. 
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
## 2015-11-22 ENCOUNTER — Encounter: Payer: Self-pay | Admitting: Gastroenterology

## 2015-11-23 ENCOUNTER — Other Ambulatory Visit: Payer: Self-pay | Admitting: Family Medicine

## 2015-12-22 DIAGNOSIS — Z87828 Personal history of other (healed) physical injury and trauma: Secondary | ICD-10-CM | POA: Diagnosis not present

## 2015-12-22 DIAGNOSIS — G459 Transient cerebral ischemic attack, unspecified: Secondary | ICD-10-CM | POA: Diagnosis not present

## 2015-12-22 DIAGNOSIS — K219 Gastro-esophageal reflux disease without esophagitis: Secondary | ICD-10-CM | POA: Diagnosis not present

## 2015-12-22 DIAGNOSIS — E669 Obesity, unspecified: Secondary | ICD-10-CM | POA: Diagnosis not present

## 2015-12-22 DIAGNOSIS — I10 Essential (primary) hypertension: Secondary | ICD-10-CM | POA: Diagnosis not present

## 2015-12-22 DIAGNOSIS — M1990 Unspecified osteoarthritis, unspecified site: Secondary | ICD-10-CM | POA: Diagnosis not present

## 2016-01-04 ENCOUNTER — Ambulatory Visit (INDEPENDENT_AMBULATORY_CARE_PROVIDER_SITE_OTHER): Payer: Medicare Other | Admitting: Family Medicine

## 2016-01-04 ENCOUNTER — Encounter: Payer: Self-pay | Admitting: Family Medicine

## 2016-01-04 VITALS — BP 118/74 | HR 79 | Temp 97.7°F | Ht 66.5 in | Wt 211.5 lb

## 2016-01-04 DIAGNOSIS — M25561 Pain in right knee: Secondary | ICD-10-CM | POA: Diagnosis not present

## 2016-01-04 MED ORDER — DIAZEPAM 5 MG PO TABS
ORAL_TABLET | ORAL | 0 refills | Status: AC
Start: 2016-01-04 — End: ?
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
## 2016-01-04 NOTE — Progress Notes (Signed)
Pre visit review using our clinic review tool, if applicable. No additional management support is needed unless otherwise documented below in the visit note. 

## 2016-01-13 ENCOUNTER — Ambulatory Visit
Admission: RE | Admit: 2016-01-13 | Discharge: 2016-01-13 | Disposition: A | Discharge status: Home / Self Care | Payer: Medicare Other | Source: Ambulatory Visit | Attending: Family Medicine | Admitting: Family Medicine

## 2016-01-13 DIAGNOSIS — M25561 Pain in right knee: Secondary | ICD-10-CM | POA: Diagnosis not present

## 2016-01-19 ENCOUNTER — Other Ambulatory Visit: Payer: Self-pay | Admitting: Family Medicine

## 2016-01-19 DIAGNOSIS — M25561 Pain in right knee: Secondary | ICD-10-CM

## 2016-01-19 DIAGNOSIS — M1711 Unilateral primary osteoarthritis, right knee: Secondary | ICD-10-CM

## 2016-02-21 DIAGNOSIS — M1711 Unilateral primary osteoarthritis, right knee: Secondary | ICD-10-CM | POA: Diagnosis not present

## 2016-02-21 DIAGNOSIS — M25561 Pain in right knee: Secondary | ICD-10-CM | POA: Diagnosis not present

## 2016-03-07 ENCOUNTER — Encounter: Payer: Self-pay | Admitting: Family Medicine

## 2016-03-07 ENCOUNTER — Ambulatory Visit (INDEPENDENT_AMBULATORY_CARE_PROVIDER_SITE_OTHER): Payer: Medicare Other | Admitting: Family Medicine

## 2016-03-07 VITALS — BP 141/58 | HR 83 | Temp 97.3°F | Ht 66.5 in | Wt 207.0 lb

## 2016-03-07 DIAGNOSIS — M25511 Pain in right shoulder: Secondary | ICD-10-CM

## 2016-03-07 DIAGNOSIS — M542 Cervicalgia: Secondary | ICD-10-CM | POA: Diagnosis not present

## 2016-03-07 DIAGNOSIS — Z23 Encounter for immunization: Secondary | ICD-10-CM | POA: Diagnosis not present

## 2016-03-07 DIAGNOSIS — M25561 Pain in right knee: Secondary | ICD-10-CM | POA: Diagnosis not present

## 2016-03-07 DIAGNOSIS — M1711 Unilateral primary osteoarthritis, right knee: Secondary | ICD-10-CM

## 2016-03-07 MED ORDER — PREDNISONE 20 MG PO TABS
ORAL_TABLET | ORAL | 0 refills | Status: AC
Start: 2016-03-07 — End: ?

## 2016-03-07 NOTE — Progress Notes (Signed)
Pre visit review using our clinic review tool, if applicable. No additional management support is needed unless otherwise documented below in the visit note. 
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
## 2016-03-08 DIAGNOSIS — H35372 Puckering of macula, left eye: Secondary | ICD-10-CM | POA: Diagnosis not present

## 2016-03-08 DIAGNOSIS — H524 Presbyopia: Secondary | ICD-10-CM | POA: Diagnosis not present

## 2016-03-08 DIAGNOSIS — H401133 Primary open-angle glaucoma, bilateral, severe stage: Secondary | ICD-10-CM | POA: Diagnosis not present

## 2016-04-23 DIAGNOSIS — I10 Essential (primary) hypertension: Secondary | ICD-10-CM | POA: Diagnosis not present

## 2016-04-23 DIAGNOSIS — R351 Nocturia: Secondary | ICD-10-CM | POA: Diagnosis not present

## 2016-04-23 DIAGNOSIS — M545 Low back pain: Secondary | ICD-10-CM | POA: Diagnosis not present

## 2016-04-23 DIAGNOSIS — R252 Cramp and spasm: Secondary | ICD-10-CM | POA: Diagnosis not present

## 2016-04-23 DIAGNOSIS — H409 Unspecified glaucoma: Secondary | ICD-10-CM | POA: Diagnosis not present

## 2016-04-25 DIAGNOSIS — M1731 Unilateral post-traumatic osteoarthritis, right knee: Secondary | ICD-10-CM | POA: Diagnosis not present

## 2016-04-25 DIAGNOSIS — M25561 Pain in right knee: Secondary | ICD-10-CM | POA: Diagnosis not present

## 2016-04-27 DIAGNOSIS — I10 Essential (primary) hypertension: Secondary | ICD-10-CM | POA: Diagnosis not present

## 2016-04-27 DIAGNOSIS — M545 Low back pain: Secondary | ICD-10-CM | POA: Diagnosis not present

## 2016-04-27 DIAGNOSIS — H409 Unspecified glaucoma: Secondary | ICD-10-CM | POA: Diagnosis not present

## 2016-04-27 DIAGNOSIS — M1731 Unilateral post-traumatic osteoarthritis, right knee: Secondary | ICD-10-CM | POA: Diagnosis not present

## 2016-04-27 DIAGNOSIS — R252 Cramp and spasm: Secondary | ICD-10-CM | POA: Diagnosis not present

## 2016-04-27 DIAGNOSIS — R7989 Other specified abnormal findings of blood chemistry: Secondary | ICD-10-CM | POA: Diagnosis not present

## 2016-04-27 DIAGNOSIS — R351 Nocturia: Secondary | ICD-10-CM | POA: Diagnosis not present

## 2016-05-18 DIAGNOSIS — M1731 Unilateral post-traumatic osteoarthritis, right knee: Secondary | ICD-10-CM | POA: Diagnosis not present

## 2016-05-21 DIAGNOSIS — S8391XD Sprain of unspecified site of right knee, subsequent encounter: Secondary | ICD-10-CM | POA: Diagnosis not present

## 2016-05-21 DIAGNOSIS — S83421D Sprain of lateral collateral ligament of right knee, subsequent encounter: Secondary | ICD-10-CM | POA: Diagnosis not present

## 2016-05-21 DIAGNOSIS — M25561 Pain in right knee: Secondary | ICD-10-CM | POA: Diagnosis not present

## 2016-05-23 DIAGNOSIS — M25561 Pain in right knee: Secondary | ICD-10-CM | POA: Diagnosis not present

## 2016-05-23 DIAGNOSIS — S83421D Sprain of lateral collateral ligament of right knee, subsequent encounter: Secondary | ICD-10-CM | POA: Diagnosis not present

## 2016-05-23 DIAGNOSIS — S8391XD Sprain of unspecified site of right knee, subsequent encounter: Secondary | ICD-10-CM | POA: Diagnosis not present

## 2016-05-24 DIAGNOSIS — S8391XD Sprain of unspecified site of right knee, subsequent encounter: Secondary | ICD-10-CM | POA: Diagnosis not present

## 2016-05-24 DIAGNOSIS — M25561 Pain in right knee: Secondary | ICD-10-CM | POA: Diagnosis not present

## 2016-05-24 DIAGNOSIS — S83421D Sprain of lateral collateral ligament of right knee, subsequent encounter: Secondary | ICD-10-CM | POA: Diagnosis not present

## 2016-05-25 DIAGNOSIS — M1731 Unilateral post-traumatic osteoarthritis, right knee: Secondary | ICD-10-CM | POA: Diagnosis not present

## 2016-05-28 DIAGNOSIS — M25561 Pain in right knee: Secondary | ICD-10-CM | POA: Diagnosis not present

## 2016-05-28 DIAGNOSIS — S83421D Sprain of lateral collateral ligament of right knee, subsequent encounter: Secondary | ICD-10-CM | POA: Diagnosis not present

## 2016-05-28 DIAGNOSIS — S8391XD Sprain of unspecified site of right knee, subsequent encounter: Secondary | ICD-10-CM | POA: Diagnosis not present

## 2016-05-30 DIAGNOSIS — M25561 Pain in right knee: Secondary | ICD-10-CM | POA: Diagnosis not present

## 2016-05-30 DIAGNOSIS — S83421D Sprain of lateral collateral ligament of right knee, subsequent encounter: Secondary | ICD-10-CM | POA: Diagnosis not present

## 2016-05-30 DIAGNOSIS — S8391XD Sprain of unspecified site of right knee, subsequent encounter: Secondary | ICD-10-CM | POA: Diagnosis not present

## 2016-05-31 DIAGNOSIS — M25561 Pain in right knee: Secondary | ICD-10-CM | POA: Diagnosis not present

## 2016-05-31 DIAGNOSIS — S83421D Sprain of lateral collateral ligament of right knee, subsequent encounter: Secondary | ICD-10-CM | POA: Diagnosis not present

## 2016-05-31 DIAGNOSIS — S8391XD Sprain of unspecified site of right knee, subsequent encounter: Secondary | ICD-10-CM | POA: Diagnosis not present

## 2016-06-01 DIAGNOSIS — M1731 Unilateral post-traumatic osteoarthritis, right knee: Secondary | ICD-10-CM | POA: Diagnosis not present

## 2016-06-04 DIAGNOSIS — M25561 Pain in right knee: Secondary | ICD-10-CM | POA: Diagnosis not present

## 2016-06-04 DIAGNOSIS — S8391XD Sprain of unspecified site of right knee, subsequent encounter: Secondary | ICD-10-CM | POA: Diagnosis not present

## 2016-06-04 DIAGNOSIS — S83421D Sprain of lateral collateral ligament of right knee, subsequent encounter: Secondary | ICD-10-CM | POA: Diagnosis not present

## 2016-06-05 DIAGNOSIS — S8391XD Sprain of unspecified site of right knee, subsequent encounter: Secondary | ICD-10-CM | POA: Diagnosis not present

## 2016-06-05 DIAGNOSIS — S83421D Sprain of lateral collateral ligament of right knee, subsequent encounter: Secondary | ICD-10-CM | POA: Diagnosis not present

## 2016-06-05 DIAGNOSIS — M25561 Pain in right knee: Secondary | ICD-10-CM | POA: Diagnosis not present

## 2016-06-07 DIAGNOSIS — M25561 Pain in right knee: Secondary | ICD-10-CM | POA: Diagnosis not present

## 2016-06-07 DIAGNOSIS — S83421D Sprain of lateral collateral ligament of right knee, subsequent encounter: Secondary | ICD-10-CM | POA: Diagnosis not present

## 2016-06-07 DIAGNOSIS — S8391XD Sprain of unspecified site of right knee, subsequent encounter: Secondary | ICD-10-CM | POA: Diagnosis not present

## 2016-06-11 DIAGNOSIS — M25561 Pain in right knee: Secondary | ICD-10-CM | POA: Diagnosis not present

## 2016-06-11 DIAGNOSIS — S83421D Sprain of lateral collateral ligament of right knee, subsequent encounter: Secondary | ICD-10-CM | POA: Diagnosis not present

## 2016-06-11 DIAGNOSIS — S8391XD Sprain of unspecified site of right knee, subsequent encounter: Secondary | ICD-10-CM | POA: Diagnosis not present

## 2016-06-12 DIAGNOSIS — S8391XD Sprain of unspecified site of right knee, subsequent encounter: Secondary | ICD-10-CM | POA: Diagnosis not present

## 2016-06-12 DIAGNOSIS — S83421D Sprain of lateral collateral ligament of right knee, subsequent encounter: Secondary | ICD-10-CM | POA: Diagnosis not present

## 2016-06-12 DIAGNOSIS — M25561 Pain in right knee: Secondary | ICD-10-CM | POA: Diagnosis not present

## 2016-06-14 DIAGNOSIS — M25561 Pain in right knee: Secondary | ICD-10-CM | POA: Diagnosis not present

## 2016-06-14 DIAGNOSIS — S8391XD Sprain of unspecified site of right knee, subsequent encounter: Secondary | ICD-10-CM | POA: Diagnosis not present

## 2016-06-14 DIAGNOSIS — S83421D Sprain of lateral collateral ligament of right knee, subsequent encounter: Secondary | ICD-10-CM | POA: Diagnosis not present

## 2016-06-19 DIAGNOSIS — S8391XD Sprain of unspecified site of right knee, subsequent encounter: Secondary | ICD-10-CM | POA: Diagnosis not present

## 2016-06-19 DIAGNOSIS — S83421D Sprain of lateral collateral ligament of right knee, subsequent encounter: Secondary | ICD-10-CM | POA: Diagnosis not present

## 2016-06-19 DIAGNOSIS — M25561 Pain in right knee: Secondary | ICD-10-CM | POA: Diagnosis not present

## 2016-06-22 DIAGNOSIS — S8391XD Sprain of unspecified site of right knee, subsequent encounter: Secondary | ICD-10-CM | POA: Diagnosis not present

## 2016-06-22 DIAGNOSIS — S83421D Sprain of lateral collateral ligament of right knee, subsequent encounter: Secondary | ICD-10-CM | POA: Diagnosis not present

## 2016-06-22 DIAGNOSIS — M25561 Pain in right knee: Secondary | ICD-10-CM | POA: Diagnosis not present

## 2016-06-26 DIAGNOSIS — M25561 Pain in right knee: Secondary | ICD-10-CM | POA: Diagnosis not present

## 2016-06-26 DIAGNOSIS — S83421D Sprain of lateral collateral ligament of right knee, subsequent encounter: Secondary | ICD-10-CM | POA: Diagnosis not present

## 2016-06-26 DIAGNOSIS — S8391XD Sprain of unspecified site of right knee, subsequent encounter: Secondary | ICD-10-CM | POA: Diagnosis not present

## 2016-06-28 DIAGNOSIS — M25561 Pain in right knee: Secondary | ICD-10-CM | POA: Diagnosis not present

## 2016-06-28 DIAGNOSIS — S8391XD Sprain of unspecified site of right knee, subsequent encounter: Secondary | ICD-10-CM | POA: Diagnosis not present

## 2016-06-28 DIAGNOSIS — S83421D Sprain of lateral collateral ligament of right knee, subsequent encounter: Secondary | ICD-10-CM | POA: Diagnosis not present

## 2016-06-28 DIAGNOSIS — M25562 Pain in left knee: Secondary | ICD-10-CM | POA: Diagnosis not present

## 2016-06-28 DIAGNOSIS — M1712 Unilateral primary osteoarthritis, left knee: Secondary | ICD-10-CM | POA: Diagnosis not present

## 2016-06-28 DIAGNOSIS — M1731 Unilateral post-traumatic osteoarthritis, right knee: Secondary | ICD-10-CM | POA: Diagnosis not present

## 2016-07-13 DIAGNOSIS — I10 Essential (primary) hypertension: Secondary | ICD-10-CM | POA: Diagnosis not present

## 2016-07-13 DIAGNOSIS — M170 Bilateral primary osteoarthritis of knee: Secondary | ICD-10-CM | POA: Diagnosis not present

## 2016-07-18 DIAGNOSIS — I10 Essential (primary) hypertension: Secondary | ICD-10-CM | POA: Diagnosis not present

## 2016-07-18 DIAGNOSIS — R7302 Impaired glucose tolerance (oral): Secondary | ICD-10-CM | POA: Diagnosis not present

## 2016-07-18 DIAGNOSIS — Z1231 Encounter for screening mammogram for malignant neoplasm of breast: Secondary | ICD-10-CM | POA: Diagnosis not present

## 2016-07-18 DIAGNOSIS — Z0000 Encounter for general adult medical examination without abnormal findings: Secondary | ICD-10-CM | POA: Diagnosis not present

## 2016-07-18 DIAGNOSIS — E538 Deficiency of other specified B group vitamins: Secondary | ICD-10-CM | POA: Diagnosis not present

## 2016-08-03 DIAGNOSIS — I10 Essential (primary) hypertension: Secondary | ICD-10-CM | POA: Diagnosis not present

## 2016-08-03 DIAGNOSIS — M170 Bilateral primary osteoarthritis of knee: Secondary | ICD-10-CM | POA: Diagnosis not present

## 2016-08-03 DIAGNOSIS — E568 Deficiency of other vitamins: Secondary | ICD-10-CM | POA: Diagnosis not present

## 2016-09-20 DIAGNOSIS — B354 Tinea corporis: Secondary | ICD-10-CM | POA: Diagnosis not present
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
## 2016-11-02 DIAGNOSIS — M25552 Pain in left hip: Secondary | ICD-10-CM | POA: Diagnosis not present

## 2016-11-02 DIAGNOSIS — M150 Primary generalized (osteo)arthritis: Secondary | ICD-10-CM | POA: Diagnosis not present

## 2016-11-02 DIAGNOSIS — I10 Essential (primary) hypertension: Secondary | ICD-10-CM | POA: Diagnosis not present

## 2016-11-16 DIAGNOSIS — S199XXA Unspecified injury of neck, initial encounter: Secondary | ICD-10-CM | POA: Diagnosis not present

## 2016-11-16 DIAGNOSIS — M5011 Cervical disc disorder with radiculopathy,  high cervical region: Secondary | ICD-10-CM | POA: Diagnosis not present

## 2016-12-12 DIAGNOSIS — H401133 Primary open-angle glaucoma, bilateral, severe stage: Secondary | ICD-10-CM | POA: Diagnosis not present

## 2016-12-12 DIAGNOSIS — Z961 Presence of intraocular lens: Secondary | ICD-10-CM | POA: Diagnosis not present

## 2016-12-17 ENCOUNTER — Telehealth: Payer: Self-pay | Admitting: Family Medicine

## 2016-12-17 NOTE — Telephone Encounter (Signed)
Left pt message asking to call Ebony Hail back directly at [phone] to schedule AWV + labs with Katha Cabal and CPE with PCP.  *NOTE* Last AWV 09/05/15

## 2017-01-12 ENCOUNTER — Other Ambulatory Visit: Payer: Self-pay | Admitting: Family Medicine

## 2017-01-15 NOTE — Telephone Encounter (Signed)
Please schedule f/u and refill until then  

## 2017-01-15 NOTE — Telephone Encounter (Signed)
Pt hasn't been seen in over a year and no future appts., please advise  

## 2017-01-17 NOTE — Telephone Encounter (Signed)
Called pt and she has moved out of state this was sent to [place] in error, Rx declined

## 2017-01-22 NOTE — Telephone Encounter (Signed)
Pt moved states

## 2017-01-31 DIAGNOSIS — H401133 Primary open-angle glaucoma, bilateral, severe stage: Secondary | ICD-10-CM | POA: Diagnosis not present

## 2017-01-31 DIAGNOSIS — Z961 Presence of intraocular lens: Secondary | ICD-10-CM | POA: Diagnosis not present

## 2017-02-01 DIAGNOSIS — I10 Essential (primary) hypertension: Secondary | ICD-10-CM | POA: Diagnosis not present

## 2017-02-01 DIAGNOSIS — M7652 Patellar tendinitis, left knee: Secondary | ICD-10-CM | POA: Diagnosis not present

## 2017-02-01 DIAGNOSIS — Z0001 Encounter for general adult medical examination with abnormal findings: Secondary | ICD-10-CM | POA: Diagnosis not present

## 2017-04-23 DIAGNOSIS — H401133 Primary open-angle glaucoma, bilateral, severe stage: Secondary | ICD-10-CM | POA: Diagnosis not present

## 2017-04-23 DIAGNOSIS — Z961 Presence of intraocular lens: Secondary | ICD-10-CM | POA: Diagnosis not present

## 2017-04-24 DIAGNOSIS — Z23 Encounter for immunization: Secondary | ICD-10-CM | POA: Diagnosis not present

## 2020-12-11 ENCOUNTER — Encounter: Payer: Self-pay | Admitting: Gastroenterology
